# Patient Record
Sex: Male | Born: 1960 | Race: White | Hispanic: No | Marital: Single | Smoking: Never smoker
Health system: Southern US, Community
[De-identification: ages and names within clinical notes are randomized; demographics above are authoritative.]

## PROBLEM LIST (undated history)

## (undated) DIAGNOSIS — I872 Venous insufficiency (chronic) (peripheral): Secondary | ICD-10-CM

---

## 2016-02-09 ENCOUNTER — Emergency Department (HOSPITAL_COMMUNITY): Payer: Managed Care, Other (non HMO)

## 2016-02-09 ENCOUNTER — Emergency Department (HOSPITAL_COMMUNITY)
Admission: EM | Admit: 2016-02-09 | Discharge: 2016-02-10 | Disposition: A | Discharge status: Home / Self Care | Payer: Managed Care, Other (non HMO) | Attending: Emergency Medicine | Admitting: Emergency Medicine

## 2016-02-09 ENCOUNTER — Encounter (HOSPITAL_COMMUNITY): Payer: Self-pay

## 2016-02-09 DIAGNOSIS — R51 Headache: Secondary | ICD-10-CM | POA: Diagnosis present

## 2016-02-09 DIAGNOSIS — H81399 Other peripheral vertigo, unspecified ear: Secondary | ICD-10-CM | POA: Insufficient documentation

## 2016-02-09 DIAGNOSIS — R42 Dizziness and giddiness: Secondary | ICD-10-CM

## 2016-02-09 LAB — BASIC METABOLIC PANEL
Anion gap: 9 (ref 5–15)
BUN: 13 mg/dL (ref 6–20)
CALCIUM: 9.1 mg/dL (ref 8.9–10.3)
CO2: 30 mmol/L (ref 22–32)
CREATININE: 0.87 mg/dL (ref 0.61–1.24)
Chloride: 98 mmol/L — ABNORMAL LOW (ref 101–111)
GFR calc Af Amer: >60 mL/min (ref >60)
GLUCOSE: 120 mg/dL — AB (ref 65–99)
POTASSIUM: 3.8 mmol/L (ref 3.5–5.1)
Sodium: 137 mmol/L (ref 135–145)

## 2016-02-09 LAB — CBC
HEMATOCRIT: 43.1 % (ref 39.0–52.0)
Hemoglobin: 14.3 g/dL (ref 13.0–17.0)
MCH: 30.8 pg (ref 26.0–34.0)
MCHC: 33.2 g/dL (ref 30.0–36.0)
MCV: 92.9 fL (ref 78.0–100.0)
PLATELETS: 208 10*3/uL (ref 150–400)
RBC: 4.64 MIL/uL (ref 4.22–5.81)
RDW: 14.6 % (ref 11.5–15.5)
WBC: 7.7 10*3/uL (ref 4.0–10.5)

## 2016-02-09 LAB — CBG MONITORING, ED: Glucose-Capillary: 108 mg/dL — ABNORMAL HIGH (ref 65–99)

## 2016-02-09 MED ORDER — IOPAMIDOL (ISOVUE-370) INJECTION 76%
INTRAVENOUS | Status: AC
  Administered 2016-02-10: 100 mL
  Filled 2016-02-09: qty 100

## 2016-02-09 MED ORDER — LORAZEPAM 2 MG/ML IJ SOLN
1.0000 mg | Freq: Once | INTRAMUSCULAR | Status: AC
  Administered 2016-02-09: 1 mg via INTRAVENOUS
  Filled 2016-02-09: qty 1

## 2016-02-09 MED ORDER — SODIUM CHLORIDE 0.9 % IV BOLUS (SEPSIS)
500.0000 mL | Freq: Once | INTRAVENOUS | Status: AC
  Administered 2016-02-09: 500 mL via INTRAVENOUS

## 2016-02-09 NOTE — ED Triage Notes (Signed)
Pt comes via [facility], c/o HA, dizziness and nausea starting around 530, has had these symptoms for about one week, PTA received 4 mg of zofran, talked to PCP and was told he may have vertigo

## 2016-02-09 NOTE — ED Notes (Signed)
Patient transported to CT 

## 2016-02-09 NOTE — ED Provider Notes (Signed)
By signing my name below, I, Linus GalasMaharshi Patel, attest that this documentation has been prepared under the direction and in the presence of Glynn OctaveStephen Samrat Hayward, MD. Electronically Signed: Linus GalasMaharshi Patel, ED Scribe. 02/09/16. 11:06 PM.  HPI Comments: Antonio Salazar is a 55 y.o. male who presents to the Emergency Department complaining of intermittent dizziness described as a spinning sensation that has been ongoing for the past one week but worsened over the last few days. Pt states he was at work sitting when he suddenly became dizzy. At 5:30 PM he began having a HA localized to the posterior base of his head which suddenly worsened 1 hour hour later at 6:30 PM. Pt states his pain is worse when he stands up and is aggravated by light. It is alleviated with rest. He states he still is dizzy while in the ED. Pt denies any fevers, chills, CP, SOB, N/V/D or any other symptoms at this time. [place]. Pt denies hx of DM or HTN.   Pt recently saw his PCP who placed him on a new diuretic for the swelling in his legs.   Physical Exam RRR, CTAB, morbidly obese  CN 2-12 intact, no ataxia on finger to nose, no nystagmus, 5/5 strength throughout, no pronator drift, Romberg positve, gait not tested,   With intermittent dizziness that resolved last week but recurred again tonight around 5:30 with sudden onset headache at 6:30 PM. Worse with standing up and movement. Nonfocal neurological exam other than positive Romberg  CT negative obtained within 6 hours of sudden onset headache. Patient morbidly obese unlikely to be able to get MRI.  Treat for apparent peripheral vertigo.  I personally performed the services described in this documentation, which was scribed in my presence. The recorded information has been reviewed and is accurate.    Glynn OctaveStephen Sira Adsit, MD 02/10/16 [phone]

## 2016-02-09 NOTE — ED Provider Notes (Signed)
[facility]-EMERGENCY DEPT Provider Note   CSN: [id number] Arrival date & time: 02/09/16  2221     History   Chief Complaint Chief Complaint  Patient presents with  . Headache  . Dizziness    HPI   Blood pressure 133/79, pulse 87, temperature 98.2 F (36.8 C), temperature source Oral, resp. rate 17, height 6\' 1"  (1.854 m), weight (!) 213.2 kg, SpO2 94 %.  Antonio Salazar is a 55 y.o. male complaining of severe occipital headache which is atypical for him with associated nausea and sensation the room is spinning onset 8 days ago following a upper respiratory infection. No pain medication taken prior to arrival, he states that his symptoms significantly worsened today to the point where he is extremely nauseous, any head movement makes this worse than normal. He is able to ambulate but with difficulty. He denies change in his vision (note that he states that he is much more comfortable when he keeps his eyes close), dysarthria, numbness, weakness, ataxia. He did start an unknown diuretic 2 weeks ago by his primary care physician.  History reviewed. No pertinent past medical history.  There are no active problems to display for this patient.   History reviewed. No pertinent surgical history.     Home Medications    Prior to Admission medications   Medication Sig Start Date End Date Taking? Authorizing Provider  hydrochlorothiazide (HYDRODIURIL) 25 MG tablet Take 25 mg by mouth daily. 01/22/16  Yes Historical Provider, MD  meclizine (ANTIVERT) 25 MG tablet Take 1 tablet (25 mg total) by mouth 3 (three) times daily as needed for dizziness. 02/10/16   Joni Reining Keaundre Thelin, PA-C    Family History No family history on file.  Social History Social History  Substance Use Topics  . Smoking status: Never Smoker  . Smokeless tobacco: Never Used  . Alcohol use No     Allergies   Patient has no known allergies.   Review of Systems Review of Systems  10 systems reviewed and found to  be negative, except as noted in the HPI.   Physical Exam Updated Vital Signs BP 103/85   Pulse 66   Temp 98.2 F (36.8 C)   Resp 13   Ht 6\' 1"  (1.854 m)   Wt (!) 202 kg   SpO2 98%   BMI 58.75 kg/m   Physical Exam  Constitutional: He is oriented to person, place, and time. He appears well-developed and well-nourished. No distress.  Morbidly obese  HENT:  Head: Normocephalic and atraumatic.  Mouth/Throat: Oropharynx is clear and moist.  Eyes: Conjunctivae and EOM are normal. Pupils are equal, round, and reactive to light.  Neck: Normal range of motion.  Cardiovascular: Normal rate, regular rhythm and intact distal pulses.   Pulmonary/Chest: Effort normal and breath sounds normal.  Abdominal: Soft. There is no tenderness.  Musculoskeletal: Normal range of motion.  Neurological: He is alert and oriented to person, place, and time.  II-Visual fields grossly intact. III/IV/VI-Extraocular movements intact.  Pupils reactive bilaterally. V/VII-Smile symmetric, equal eyebrow raise,  facial sensation intact VIII- Hearing grossly intact IX/X-Normal gag XI-bilateral shoulder shrug XII-midline tongue extension Motor: 5/5 bilaterally with normal tone and bulk Cerebellar: Normal finger-to-nose  and normal heel-to-shin test.    Patient is not ambulated  Dix-Hallpike with no nystagmus bilaterally   Skin: He is not diaphoretic.  Psychiatric: He has a normal mood and affect.  Nursing note and vitals reviewed.    ED Treatments / Results  Labs (all labs ordered are  listed, but only abnormal results are displayed) Labs Reviewed  BASIC METABOLIC PANEL - Abnormal; Notable for the following:       Result Value   Chloride 98 (*)    Glucose, Bld 120 (*)    All other components within normal limits  URINALYSIS, ROUTINE W REFLEX MICROSCOPIC (NOT AT [facility]) - Abnormal; Notable for the following:    Specific Gravity, Urine 1.035 (*)    All other components within normal limits  CBG  MONITORING, ED - Abnormal; Notable for the following:    Glucose-Capillary 108 (*)    All other components within normal limits  CBC    EKG  EKG Interpretation None       Radiology Ct Angio Head W Or Wo Contrast  Result Date: 02/10/2016 CLINICAL DATA:  55 y/o  M; headache and dizziness. EXAM: CT OF THE HEAD WITHOUT CONTRAST CT ANGIOGRAPHY HEAD AND NECK TECHNIQUE: Multidetector CT imaging of the head and neck was performed using the standard protocol during bolus administration of intravenous contrast. Multiplanar CT image reconstructions and MIPs were obtained to evaluate the vascular anatomy. Carotid stenosis measurements (when applicable) are obtained utilizing NASCET criteria, using the distal internal carotid diameter as the denominator. CONTRAST:  100 cc Isovue 370 COMPARISON:  None. FINDINGS: CT HEAD FINDINGS Brain: No evidence of acute infarction, hemorrhage, hydrocephalus, extra-axial collection or mass lesion/mass effect. Vascular: See below. Skull: No fracture or destructive lesion. Mastoids and middle ears are clear. Sinuses: Right maxillary sinus mucous retention cyst. Orbits: No acute finding. Review of the MIP images confirms the above findings CTA NECK FINDINGS Aortic arch: Standard branching. Imaged portion shows no evidence of aneurysm or dissection. No significant stenosis of the major arch vessel origins. 3.6 cm arch (series 22, image 337). Right carotid system: No evidence of dissection, stenosis (50% or greater) or occlusion. Left carotid system: No evidence of dissection, stenosis (50% or greater) or occlusion. Mild calcific atherosclerosis of the bifurcation without significant stenosis. Vertebral arteries: Left dominant, the right likely terminates in PICA. No evidence of dissection, stenosis (50% or greater) or occlusion. Skeleton: No acute osseous abnormality. Mild cervical spondylosis without high-grade canal stenosis or foraminal narrowing. Other neck: No lymphadenopathy,  mass, or inflammatory process is identified. Patent aerodigestive tract. Upper chest: No acute process. Review of the MIP images confirms the above findings CTA HEAD FINDINGS Anterior circulation: No significant stenosis, proximal occlusion, aneurysm, or vascular malformation. Posterior circulation: No significant stenosis, proximal occlusion, aneurysm, or vascular malformation. Venous sinuses: As permitted by contrast timing, patent. Anatomic variants: Left fetal PCA. Diminutive A-comm. No right P-comm identified, likely hypoplastic or absent. Small caliber right vertebral artery largely terminates in diminutive PICA. Delayed phase: No abnormal intracranial enhancement. Review of the MIP images confirms the above findings IMPRESSION: 1. No evidence of dissection, hemodynamically significant stenosis, or occlusion of the carotid and vertebral arteries of the neck. 2. No significant stenosis, proximal occlusion, aneurysm, or vascular malformation of circle of Willis. 3. No acute intracranial abnormality is identified. No abnormal intracranial enhancement. 4. Mild aortic atherosclerosis. 5. Mild calcification of left carotid bifurcation without significant stenosis. 6. 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 Recommend annual imaging followup by CTA or MRA. This recommendation follows 2010 ACCF/AHA/AATS/ACR/ASA/SCA/SCAI/SIR/STS/SVM Guidelines for the Diagnosis and Management of Patients with Thoracic Aortic Disease. Circulation.2010; 121: Z610-R604 Electronically Signed   By: Mitzi Hansen M.D.   On: 02/10/2016 00:50   Ct Head Wo Contrast  Result Date: 02/10/2016 CLINICAL DATA:  55 y/o  M; headache and dizziness.  EXAM: CT OF THE HEAD WITHOUT CONTRAST CT ANGIOGRAPHY HEAD AND NECK TECHNIQUE: Multidetector CT imaging of the head and neck was performed using the standard protocol during bolus administration of intravenous contrast. Multiplanar CT image reconstructions and MIPs were obtained to evaluate the vascular anatomy.  Carotid stenosis measurements (when applicable) are obtained utilizing NASCET criteria, using the distal internal carotid diameter as the denominator. CONTRAST:  100 cc Isovue 370 COMPARISON:  None. FINDINGS: CT HEAD FINDINGS Brain: No evidence of acute infarction, hemorrhage, hydrocephalus, extra-axial collection or mass lesion/mass effect. Vascular: See below. Skull: No fracture or destructive lesion. Mastoids and middle ears are clear. Sinuses: Right maxillary sinus mucous retention cyst. Orbits: No acute finding. Review of the MIP images confirms the above findings CTA NECK FINDINGS Aortic arch: Standard branching. Imaged portion shows no evidence of aneurysm or dissection. No significant stenosis of the major arch vessel origins. 3.6 cm arch (series 22, image 337). Right carotid system: No evidence of dissection, stenosis (50% or greater) or occlusion. Left carotid system: No evidence of dissection, stenosis (50% or greater) or occlusion. Mild calcific atherosclerosis of the bifurcation without significant stenosis. Vertebral arteries: Left dominant, the right likely terminates in PICA. No evidence of dissection, stenosis (50% or greater) or occlusion. Skeleton: No acute osseous abnormality. Mild cervical spondylosis without high-grade canal stenosis or foraminal narrowing. Other neck: No lymphadenopathy, mass, or inflammatory process is identified. Patent aerodigestive tract. Upper chest: No acute process. Review of the MIP images confirms the above findings CTA HEAD FINDINGS Anterior circulation: No significant stenosis, proximal occlusion, aneurysm, or vascular malformation. Posterior circulation: No significant stenosis, proximal occlusion, aneurysm, or vascular malformation. Venous sinuses: As permitted by contrast timing, patent. Anatomic variants: Left fetal PCA. Diminutive A-comm. No right P-comm identified, likely hypoplastic or absent. Small caliber right vertebral artery largely terminates in  diminutive PICA. Delayed phase: No abnormal intracranial enhancement. Review of the MIP images confirms the above findings IMPRESSION: 1. No evidence of dissection, hemodynamically significant stenosis, or occlusion of the carotid and vertebral arteries of the neck. 2. No significant stenosis, proximal occlusion, aneurysm, or vascular malformation of circle of Willis. 3. No acute intracranial abnormality is identified. No abnormal intracranial enhancement. 4. Mild aortic atherosclerosis. 5. Mild calcification of left carotid bifurcation without significant stenosis. 6. 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 Recommend annual imaging followup by CTA or MRA. This recommendation follows 2010 ACCF/AHA/AATS/ACR/ASA/SCA/SCAI/SIR/STS/SVM Guidelines for the Diagnosis and Management of Patients with Thoracic Aortic Disease. Circulation.2010; 121: Z610-R604: [id number] Electronically Signed   By: Mitzi HansenLance  Furusawa-Stratton M.D.   On: 02/10/2016 00:50   Ct Angio Neck W And/or Wo Contrast  Result Date: 02/10/2016 CLINICAL DATA:  55 y/o  M; headache and dizziness. EXAM: CT OF THE HEAD WITHOUT CONTRAST CT ANGIOGRAPHY HEAD AND NECK TECHNIQUE: Multidetector CT imaging of the head and neck was performed using the standard protocol during bolus administration of intravenous contrast. Multiplanar CT image reconstructions and MIPs were obtained to evaluate the vascular anatomy. Carotid stenosis measurements (when applicable) are obtained utilizing NASCET criteria, using the distal internal carotid diameter as the denominator. CONTRAST:  100 cc Isovue 370 COMPARISON:  None. FINDINGS: CT HEAD FINDINGS Brain: No evidence of acute infarction, hemorrhage, hydrocephalus, extra-axial collection or mass lesion/mass effect. Vascular: See below. Skull: No fracture or destructive lesion. Mastoids and middle ears are clear. Sinuses: Right maxillary sinus mucous retention cyst. Orbits: No acute finding. Review of the MIP images confirms the above findings CTA NECK FINDINGS  Aortic arch: Standard branching. Imaged portion shows no evidence  of aneurysm or dissection. No significant stenosis of the major arch vessel origins. 3.6 cm arch (series 22, image 337). Right carotid system: No evidence of dissection, stenosis (50% or greater) or occlusion. Left carotid system: No evidence of dissection, stenosis (50% or greater) or occlusion. Mild calcific atherosclerosis of the bifurcation without significant stenosis. Vertebral arteries: Left dominant, the right likely terminates in PICA. No evidence of dissection, stenosis (50% or greater) or occlusion. Skeleton: No acute osseous abnormality. Mild cervical spondylosis without high-grade canal stenosis or foraminal narrowing. Other neck: No lymphadenopathy, mass, or inflammatory process is identified. Patent aerodigestive tract. Upper chest: No acute process. Review of the MIP images confirms the above findings CTA HEAD FINDINGS Anterior circulation: No significant stenosis, proximal occlusion, aneurysm, or vascular malformation. Posterior circulation: No significant stenosis, proximal occlusion, aneurysm, or vascular malformation. Venous sinuses: As permitted by contrast timing, patent. Anatomic variants: Left fetal PCA. Diminutive A-comm. No right P-comm identified, likely hypoplastic or absent. Small caliber right vertebral artery largely terminates in diminutive PICA. Delayed phase: No abnormal intracranial enhancement. Review of the MIP images confirms the above findings IMPRESSION: 1. No evidence of dissection, hemodynamically significant stenosis, or occlusion of the carotid and vertebral arteries of the neck. 2. No significant stenosis, proximal occlusion, aneurysm, or vascular malformation of circle of Willis. 3. No acute intracranial abnormality is identified. No abnormal intracranial enhancement. 4. Mild aortic atherosclerosis. 5. Mild calcification of left carotid bifurcation without significant stenosis. 6. 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  Recommend annual imaging followup by CTA or MRA. This recommendation follows 2010 ACCF/AHA/AATS/ACR/ASA/SCA/SCAI/SIR/STS/SVM Guidelines for the Diagnosis and Management of Patients with Thoracic Aortic Disease. Circulation.2010; 121: Z610-R604 Electronically Signed   By: Mitzi Hansen M.D.   On: 02/10/2016 00:50   Mr Brain Wo Contrast  Result Date: 02/10/2016 CLINICAL DATA:  55 y/o  M; ataxia. EXAM: MRI HEAD WITHOUT CONTRAST TECHNIQUE: Multiplanar, multiecho pulse sequences of the brain and surrounding structures were obtained without intravenous contrast. COMPARISON:  CTA head and neck and CT head dated 02/09/2016. FINDINGS: Brain: No diffusion signal abnormality. Motion degraded T2 FLAIR and SWI sequences. No large gross hemorrhage identified. No focal mass effect, extra-axial collection, or effacement of basilar cisterns. Vascular: Normal flow voids. Skull and upper cervical spine: Normal marrow signal. Sinuses/Orbits: Right maxillary sinus mucous retention cyst. No abnormal signal of mastoid air cells. Orbits are unremarkable. Other: None. IMPRESSION: No acute intracranial abnormality identified. Right maxillary sinus mucous retention cyst. Otherwise unremarkable MRI of the brain. Motion degradation of several sequences. Electronically Signed   By: Mitzi Hansen M.D.   On: 02/10/2016 04:42    Procedures Procedures (including critical care time)  Medications Ordered in ED Medications  sodium chloride 0.9 % bolus 500 mL (0 mLs Intravenous Stopped 02/10/16 0549)  LORazepam (ATIVAN) injection 1 mg (1 mg Intravenous Given 02/09/16 2315)  iopamidol (ISOVUE-370) 76 % injection (100 mLs  Contrast Given 02/10/16 0002)  meclizine (ANTIVERT) tablet 25 mg (25 mg Oral Given 02/10/16 0211)     Initial Impression / Assessment and Plan / ED Course  I have reviewed the triage vital signs and the nursing notes.  Pertinent labs & imaging results that were available during my care of the  patient were reviewed by me and considered in my medical decision making (see chart for details).  Clinical Course    Vitals:   02/10/16 0209 02/10/16 0230 02/10/16 0300 02/10/16 0532  BP:  129/81 136/82 103/85  Pulse:  81 66   Resp:  14  13  Temp: 98.2 F (36.8 C)     TempSrc:      SpO2:  96% 98%   Weight:      Height:        Medications  sodium chloride 0.9 % bolus 500 mL (0 mLs Intravenous Stopped 02/10/16 0549)  LORazepam (ATIVAN) injection 1 mg (1 mg Intravenous Given 02/09/16 2315)  iopamidol (ISOVUE-370) 76 % injection (100 mLs  Contrast Given 02/10/16 0002)  meclizine (ANTIVERT) tablet 25 mg (25 mg Oral Given 02/10/16 0211)    Antonio Salazar is 55 y.o. male presenting with Severe vertigo, he is also reporting a severe occipital headache which is atypical for him. Neurologic exam is grossly nonfocal however Dix-Hallpike is negative, patient is not ambulated. Given the severity of his headache would consider a central cause however, with the acute onset think is likely peripheral. CT angiogram negative, MRI negative. Trial of ambulation successful. Patient given ENT follow-up.  Upon discharge patient states that he does not feel comfortable going home because he is afraid he might fall. Discussed with Dr. Toniann FailKakrakandy, who states the patient can be evaluated by PT in the a.m. and if they feel he is appropriate for admission.   Case signed out to PA Hedges at shift change.   Final Clinical Impressions(s) / ED Diagnoses   Final diagnoses:  Peripheral vertigo, unspecified laterality    New Prescriptions New Prescriptions   MECLIZINE (ANTIVERT) 25 MG TABLET    Take 1 tablet (25 mg total) by mouth 3 (three) times daily as needed for dizziness.     Wynetta Emeryicole Wing Gfeller, PA-C 02/10/16 0457    Wynetta EmeryNicole Raegan Winders, PA-C 02/10/16 [id number]    Glynn OctaveStephen Rancour, MD 02/10/16 [phone]

## 2016-02-10 ENCOUNTER — Emergency Department (HOSPITAL_COMMUNITY): Payer: Managed Care, Other (non HMO)

## 2016-02-10 LAB — URINALYSIS, ROUTINE W REFLEX MICROSCOPIC
BILIRUBIN URINE: NEGATIVE
GLUCOSE, UA: NEGATIVE mg/dL
Hgb urine dipstick: NEGATIVE
KETONES UR: NEGATIVE mg/dL
Leukocytes, UA: NEGATIVE
NITRITE: NEGATIVE
PH: 6 (ref 5.0–8.0)
Protein, ur: NEGATIVE mg/dL
Specific Gravity, Urine: 1.035 — ABNORMAL HIGH (ref 1.005–1.030)

## 2016-02-10 MED ORDER — MECLIZINE HCL 25 MG PO TABS: 25.0000 mg | ORAL_TABLET | Freq: Three times a day (TID) | ORAL | 0 refills | Status: AC | PRN

## 2016-02-10 MED ORDER — MECLIZINE HCL 25 MG PO TABS
25.0000 mg | ORAL_TABLET | Freq: Once | ORAL | Status: AC
  Administered 2016-02-10: 25 mg via ORAL
  Filled 2016-02-10: qty 1

## 2016-02-10 NOTE — ED Notes (Signed)
Pt ambulated around room with steady gait, pt still complains of dizziness

## 2016-02-10 NOTE — ED Notes (Signed)
Spoke with case management about pt referral to outpt PT follow-up. Case management able to put referral in.

## 2016-02-10 NOTE — ED Notes (Signed)
Spoke with Pt and they recommend outpt PT follow-up. Eyvonne MechanicJeffrey Hedges, PA notified.

## 2016-02-10 NOTE — ED Notes (Signed)
Patient transported back from CT 

## 2016-02-10 NOTE — ED Notes (Signed)
PT contacted to ensure pt is on list for them to see.

## 2016-02-10 NOTE — Discharge Instructions (Signed)
Please follow with your primary care doctor in the next 2 days for a check-up. They must obtain records for further management.  ° °Do not hesitate to return to the Emergency Department for any new, worsening or concerning symptoms.  ° °

## 2016-02-10 NOTE — ED Notes (Signed)
Physical Therapists are here to see patient

## 2016-02-10 NOTE — ED Notes (Signed)
Patient had already d/c'd himself from the monitor, continuous pulse oximetry and blood pressure cuff; I d/c'd patient from IV, patient is getting dressed to be discharged home

## 2016-02-10 NOTE — Discharge Planning (Signed)
[facility] and vestibular [facility]hab.  Referral placed and Outpatient Rehab will contact pt with appointment date and time.

## 2016-02-10 NOTE — Progress Notes (Signed)
Physical Therapy Evaluation Patient Details Name: Antonio Salazar MRN: [id number] DOB: 22-Apr-1960 Today's Date: 02/10/2016   History of Present Illness  Antonio Mangorthur Muzyka is a 55 y.o. male who presents to the Emergency Department complaining of intermittent dizziness described as a spinning sensation that has been ongoing for the past one week but worsened over the last few days. Pt states he was at work sitting when he suddenly became dizzy. At 5:30 PM he began having a HA localized to the posterior base of his head which suddenly worsened 1 hour hour later at 6:30 PM. Pt states his pain is worse when he stands up and is aggravated by light. It is alleviated with rest.  CT and MRI negative for stroke and hemorrhage  Clinical Impression  Vestibular assessment completed however unable to illicit positional vertigo, suspect due to meclizine given at 215am. Pt functioning at mod I but suspect based on report of symptoms prior to coming to ED pt with peripheral vestibular dysfunction. Recommending outpt for vestibular assessment. Advised pt to not take meclizine 24 hours prior to PT appt if tolerable for accurate assessment. Acute PT to con't to follow.    Follow Up Recommendations Outpatient PT;Supervision - Intermittent (for vestibular treatement)    Equipment Recommendations  None recommended by PT    Recommendations for Other Services       Precautions / Restrictions Precautions Precautions: Fall Precaution Comments: dizziness with mobility Restrictions Weight Bearing Restrictions: No      Mobility  Bed Mobility Overal bed mobility: Modified Independent             General bed mobility comments: pt reports mild lightheadedness with transistion from supine to sit but went away s/p 15 seconds  Transfers Overall transfer level: Modified independent Equipment used: None             General transfer comment: no signs of instability or  dizziness  Ambulation/Gait Ambulation/Gait assistance: Modified independent (Device/Increase time) Ambulation Distance (Feet): 200 Feet Assistive device: None Gait Pattern/deviations: WFL(Within Functional Limits) Gait velocity: wfl Gait velocity interpretation: at or above normal speed for age/gender General Gait Details: pt dizziness or lightheadedness, pt able to turn head left and right, up and down without report of dizziness  Stairs            Wheelchair Mobility    Modified Rankin (Stroke Patients Only)       Balance Overall balance assessment: No apparent balance deficits (not formally assessed)                                           Pertinent Vitals/Pain Pain Assessment: 0-10 Pain Score: 5  Pain Location: base of occiput Pain Descriptors / Indicators: Headache Pain Intervention(s): Monitored during session    Home Living Family/patient expects to be discharged to:: Private residence Living Arrangements: Alone Available Help at Discharge: Family;Available 24 hours/day Type of Home: House Home Access: Stairs to enter Entrance Stairs-Rails: Right Entrance Stairs-Number of Steps: flight (x2) Home Layout: One level        Prior Function Level of Independence: Independent         Comments: works as a Administrator, artssupervision at [employer]     Hand Dominance   Dominant Hand: Right    Extremity/Trunk Assessment   Upper Extremity Assessment: Overall WFL for tasks assessed           Lower Extremity  Assessment: Overall WFL for tasks assessed      Cervical / Trunk Assessment: Normal  Communication   Communication: No difficulties  Cognition Arousal/Alertness: Awake/alert Behavior During Therapy: WFL for tasks assessed/performed Overall Cognitive Status: Within Functional Limits for tasks assessed                      General Comments General comments (skin integrity, edema, etc.): Vestibular Assessment: pt received meclizine  at 215am. no nystagmus or report of dizziness with dix halpike to L or R or during horizontal roll test ruling out posterior/horizontal canal BPPV. Pt with difficulty focusing on PTs nose during head thrust test. unable to to illicit dizziness with positioning. could be because of meclinize    Exercises     Assessment/Plan    PT Assessment Patient needs continued PT services  PT Problem List Other (comment) (vestibular dysfunction)          PT Treatment Interventions Other (comment) (vestibular treatment)    PT Goals (Current goals can be found in the Care Plan section)  Acute Rehab PT Goals Patient Stated Goal: home asap PT Goal Formulation: With patient Time For Goal Achievement: 02/17/16 Potential to Achieve Goals: Good Additional Goals Additional Goal #1: Pt to be indep with gaze stabilization compensatory technique 100% of time    Frequency Min 3X/week (for vestibular)   Barriers to discharge        Co-evaluation               End of Session   Activity Tolerance: Patient tolerated treatment well Patient left: in bed;with call bell/phone within reach Nurse Communication: Mobility status    Functional Assessment Tool Used: clinical judgement Functional Limitation: Mobility: Walking and moving around Mobility: Walking and Moving Around Current Status (A5409(G8978): At least 20 percent but less than 40 percent impaired, limited or restricted Mobility: Walking and Moving Around Goal Status [phone](G8979): At least 1 percent but less than 20 percent impaired, limited or restricted    Time: 1002-1036 PT Time Calculation (min) (ACUTE ONLY): 34 min   Charges:   PT Evaluation $PT Eval Moderate Complexity: 1 Procedure PT Treatments $Canalith Rep Proc: 8-22 mins   PT G Codes:   PT G-Codes **NOT FOR INPATIENT CLASS** Functional Assessment Tool Used: clinical judgement Functional Limitation: Mobility: Walking and moving around Mobility: Walking and Moving Around Current  Status (Y7829(G8978): At least 20 percent but less than 40 percent impaired, limited or restricted Mobility: Walking and Moving Around Goal Status [phone](G8979): At least 1 percent but less than 20 percent impaired, limited or restricted    Antonio Salazar 02/10/2016, 11:22 AM   Antonio Salazar, PT, DPT Pager #: [phone] Office #: [phone]

## 2016-02-26 ENCOUNTER — Ambulatory Visit: Payer: Managed Care, Other (non HMO) | Attending: Physician Assistant

## 2016-03-01 ENCOUNTER — Ambulatory Visit: Payer: Managed Care, Other (non HMO)

## 2016-03-09 ENCOUNTER — Ambulatory Visit: Payer: Managed Care, Other (non HMO) | Admitting: *Deleted

## 2016-03-25 ENCOUNTER — Ambulatory Visit: Payer: Commercial Managed Care - PPO | Attending: Physician Assistant

## 2016-03-25 DIAGNOSIS — R42 Dizziness and giddiness: Secondary | ICD-10-CM | POA: Insufficient documentation

## 2016-03-25 DIAGNOSIS — R2689 Other abnormalities of gait and mobility: Secondary | ICD-10-CM | POA: Diagnosis present

## 2016-03-25 NOTE — Therapy (Signed)
[facility] [address] [place], [place], [address] Phone: [phone]   Fax:  [phone]  Physical Therapy Evaluation  Patient Details  Name: Antonio Salazar MRN: [id number] Date of Birth: [birth year] Referring Provider: Dr. Jearld FentonByers, Dr. Glynn OctaveStephen Rancour  Encounter Date: 03/25/2016      PT End of Session - 03/25/16 1701    Visit Number 1   Number of Visits 17   Date for PT Re-Evaluation 05/24/16   Authorization Type [employer]-60 visit limit   PT Start Time 1405   PT Stop Time 1451   PT Time Calculation (min) 46 min   Activity Tolerance Other (comment)  limited by dizziness   Behavior During Therapy [facility] for tasks assessed/performed      History reviewed. No pertinent past medical history.  History reviewed. No pertinent surgical history.  There were no vitals filed for this visit.       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 Recent episode of vertigo began on 02/10/16 (works night shift, 4:30pm-3:00pm) while at work, sudden onset. Pt reports he felt dizzy and believes he passed out for approx. 20 minutes. Pt reports tinnitus and pressure in the occiput area occurs with dizziness. Pt reports he also experienced nausea with initial dizziness and went to the ED. The imaging was negative in ED, and he was told he has vertigo. 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 Jearld FentonByers (per pt report). Pt reports a high pitched "hissing" sound. Pt did not wish to complete positional testing today.    Pertinent History No significant PMH    Patient Stated Goals "I want to be able to function again".    Currently in Pain? Yes   Pain Score 5    Pain Location Head  Headache   Pain Orientation  Posterior   Pain Descriptors / Indicators Headache   Pain Type Chronic pain   Pain Onset More than a month ago   Pain Frequency Constant   Aggravating Factors  flashing lights   Pain Relieving Factors rest            [facility] - 03/25/16 1432      Assessment   Medical Diagnosis Peripheral vertigo   Referring Provider Dr. Jearld FentonByers, Dr. Jeannett SeniorStephen Rancour   Onset Date/Surgical Date 02/09/17   Prior Therapy in acute care on 02/10/16     Precautions   Precautions Fall     Restrictions   Weight Bearing Restrictions No     Balance Screen   Has the patient fallen in the past 6 months Yes   How many times? 2   Has the patient had a decrease in activity level because of a fear of falling?  Yes   Is the patient reluctant to leave their home because of a fear of falling?  Yes     Home Environment   Living Environment Private residence   Living Arrangements Alone   Available Help at Discharge Family   Type of Home House   Home Access Stairs to enter  pt enters through basement   Entrance Stairs-Number of Steps 12   Entrance Stairs-Rails Can reach both   Home Layout Two level   Home Equipment None     Prior Function  Level of Independence Independent   Vocation Full time employment   Vocation Requirements [employer] job     Cognition   Overall Cognitive Status Within Functional Limits for tasks assessed     Observation/Other Assessments   Focus on Therapeutic Outcomes (FOTO)  DHI: 90%-indicates pt perceives dizziness has severe impact functional activities.      Ambulation/Gait   Ambulation/Gait Yes   Ambulation/Gait Assistance 5: Supervision   Ambulation/Gait Assistance Details Pt noted to amb. in guarded manner, especially during turns.    Ambulation Distance (Feet) 100 Feet   Assistive device None   Gait Pattern Step-through pattern;Decreased arm swing - right;Decreased arm swing - left;Decreased stride length;Wide base of support   Ambulation Surface  Level;Indoor   Gait velocity 2.88ft/sec.            Vestibular Assessment - 03/25/16 1443      Vestibular Assessment   General Observation Pt reported some improvement while taking prednisone taper, which he completed approx. 2 weeks ago. Dizziness: 8/10 at worst; 2-3/10 at best     Symptom Behavior   Type of Dizziness Spinning   Frequency of Dizziness Daily   Duration of Dizziness Constant   Aggravating Factors Turning head quickly   Relieving Factors Rest     Occulomotor Exam   Occulomotor Alignment Normal   Spontaneous Absent   Gaze-induced Absent   Smooth Pursuits Intact   Saccades Intact   Comment Pt reported dizziness during down gaze smooth pursuits.     Vestibulo-Occular Reflex   VOR 1 Head Only (x 1 viewing) Pt had difficulty keeping eyes open and reported 7-8/10 dizziness. Pt required seated rest break to allow dizziness to subside, <1 minute.                       PT Education - 03/25/16 1700    Education provided Yes   Education Details PT discussed outcome measures and POC (frequency/duration) and that next session we would perform positional testing to r/o BPPV. Pt agreeable to positional testing next session.    Person(s) Educated Patient   Methods Explanation   Comprehension Verbalized understanding          PT Short Term Goals - 03/25/16 1710      PT SHORT TERM GOAL #1   Title Pt will be IND in HEP to improve dizziness and balance. TARGET DATE FOR ALL STGS: 04/22/16   Status New     PT SHORT TERM GOAL #2   Title Pt will verbalize dizziness is 5/10 at worst during head turns, in order to improve safety during functional mobility.    Status New     PT SHORT TERM GOAL #3   Title Perform FGA as indicated and write goal.    Status New     PT SHORT TERM GOAL #4   Title Perform positional testing and write goals as indicated.   Status New           PT Long Term Goals - 03/25/16 1711      PT LONG TERM GOAL #1   Title Pt will  improve gait speed to >/=2.37ft/sec to safely amb. in the community. TARGET DATE: 05/20/16   Status New     PT LONG TERM GOAL #2   Title Pt will amb. 500', IND, over even/uneven terrain, with </=1/10 dizziness while performing head turns in order to improve functional mobility.    Status New     PT LONG TERM  GOAL #3   Title Pt will will improve DHI score to >/=72% to improve quality of life.    Status New               Plan - 03/25/16 1702    Clinical Impression Statement Pt is a pleasant 55y/o male presenting to OPPT neuro for vertigo. Pt's PMH is not significant but he does have family hx of vertigo. The following impairments were noted during pt's exam: dizziness, gait deviations, and impaired balance. Pt's sx's provoked during VOR indicating decr. vestibular input. Positional testing not performed today 2/2 time constraints and pt request to not perform testing today, therefore, PT will perform next session as tolerated by pt. PT reviewed Dr. Jearld Fenton notes, and saw that he diagnosed pt with sensorineural hearing loss and potential acute labrynthitis. Pt's gait speed indicates pt is just above the cut-off for falls risk, however, pt has exeperienced 2 falls in the last 6 months. Pt's gait speed indicates pt is not able to safely amb. in the community. Pt's DHI score indicates pt feels dizziness has severe impact on functional abilities.    Rehab Potential Good   Clinical Impairments Affecting Rehab Potential obesity might impact pt's ability to obtain testing/treatment positions   PT Frequency 2x / week   PT Duration 8 weeks   PT Treatment/Interventions ADLs/Self Care Home Management;Biofeedback;Canalith Repostioning;Neuromuscular re-education;Balance training;Therapeutic exercise;Therapeutic activities;Functional mobility training;Stair training;Gait training;DME Instruction;Patient/family education;Vestibular;Manual techniques   PT Next Visit Plan Positional testing and treat for BPPV as  indicated, initiate gaze stab. HEP as tolerated.    Consulted and Agree with Plan of Care Patient      Patient will benefit from skilled therapeutic intervention in order to improve the following deficits and impairments:  Abnormal gait, Decreased endurance, Obesity, Pain, Impaired flexibility, Dizziness, Decreased balance, Decreased mobility, Decreased knowledge of use of DME (PT will not directly address pain but will continue to monitor closely.)  Visit Diagnosis: Dizziness and giddiness - Plan: PT plan of care cert/re-cert  Other abnormalities of gait and mobility - Plan: PT plan of care cert/re-cert     Problem List There are no active problems to display for this patient.   Antonio Salazar L 03/25/2016, 5:16 PM  [facility] [address] [place], [place], [address] Phone: [phone]   Fax:  [phone]  Name: Antonio Salazar MRN: [id number] Date of Birth: 1960-08-10  Zerita Boers, PT,DPT 03/25/16 5:16 PM Phone: [phone] Fax: [phone]

## 2016-03-29 ENCOUNTER — Ambulatory Visit: Payer: Commercial Managed Care - PPO

## 2016-03-29 DIAGNOSIS — R42 Dizziness and giddiness: Secondary | ICD-10-CM | POA: Diagnosis not present

## 2016-03-29 NOTE — Therapy (Signed)
[facility] [address] [place], [place], [address] Phone: [phone]   Fax:  [phone]  Physical Therapy Treatment  Patient Details  Name: Antonio Salazar MRN: [id number] Date of Birth: 02/02/1961 Referring Provider: Dr. Jearld Fenton, Dr. Glynn Octave  Encounter Date: 03/29/2016      PT End of Session - 03/29/16 1001    Visit Number 2   Number of Visits 17   Date for PT Re-Evaluation 05/24/16   Authorization Type [employer]-60 visit limit   PT Start Time 0925   PT Stop Time 0957   PT Time Calculation (min) 32 min   Activity Tolerance Other (comment)  limited by nausea and dizziness   Behavior During Therapy [facility] for tasks assessed/performed      History reviewed. No pertinent past medical history.  History reviewed. No pertinent surgical history.  There were no vitals filed for this visit.      Subjective Assessment - 03/29/16 0929    Subjective Pt reported he has not taken Meclizine since Sunday morning and reports it does help reduce dizziness. Pt reported mornings are worse for dizziness. Current dizziness: 7/10.    Pertinent History No significant PMH    Patient Stated Goals "I want to be able to function again".    Currently in Pain? Yes   Pain Location Head   Pain Orientation Posterior   Pain Descriptors / Indicators Headache   Pain Type Chronic pain   Pain Onset More than a month ago   Pain Frequency Constant   Aggravating Factors  flashing lights, TV   Pain Relieving Factors rest                Vestibular Assessment - 03/29/16 0932      Positional Testing   Dix-Hallpike Dix-Hallpike Right;Dix-Hallpike Left   Horizontal Canal Testing Horizontal Canal Right;Horizontal Canal Left     Dix-Hallpike Right   Dix-Hallpike Right Duration Pt reported 8/10. Pt reported dizziness feels like "someone is going to flip me but not spinning".   Dix-Hallpike Right Symptoms No nystagmus     Dix-Hallpike  Left   Dix-Hallpike Left Duration Pt reported 8/10 dizziness.    Dix-Hallpike Left Symptoms No nystagmus     Horizontal Canal Right   Horizontal Canal Right Duration 8/10 dizziness   Horizontal Canal Right Symptoms Normal     Horizontal Canal Left   Horizontal Canal Left Duration 8/10 dizziness   Horizontal Canal Left Symptoms Normal     Positional Sensitivities   Supine to Sitting Severe dizziness   Positional Sensitivities Comments Pt had to sit for 20 minutes after positional testing in order to allow dizziness to subside. Pt reports nausea during positional testing and supine to sit.                          PT Education - 03/29/16 1000    Education provided Yes   Education Details PT discussed pt following up with MD to medically manage dizziness, as pt is unable to participate in vestibular rehab at this time due to severity of symptoms. He was unable to tolerate any exercises at this time. PT will place pt on hold until his symptoms are less severe and send note to MD.    Person(s) Educated Patient   Methods Explanation   Comprehension Verbalized understanding          PT Short Term Goals - 03/25/16 1710      PT  SHORT TERM GOAL #1   Title Pt will be IND in HEP to improve dizziness and balance. TARGET DATE FOR ALL STGS: 04/22/16   Status New     PT SHORT TERM GOAL #2   Title Pt will verbalize dizziness is 5/10 at worst during head turns, in order to improve safety during functional mobility.    Status New     PT SHORT TERM GOAL #3   Title Perform FGA as indicated and write goal.    Status New     PT SHORT TERM GOAL #4   Title Perform positional testing and write goals as indicated.   Status New           PT Long Term Goals - 03/25/16 1711      PT LONG TERM GOAL #1   Title Pt will improve gait speed to >/=2.6862ft/sec to safely amb. in the community. TARGET DATE: 05/20/16   Status New     PT LONG TERM GOAL #2   Title Pt will amb. 500', IND,  over even/uneven terrain, with </=1/10 dizziness while performing head turns in order to improve functional mobility.    Status New     PT LONG TERM GOAL #3   Title Pt will will improve DHI score to >/=72% to improve quality of life.    Status New               Plan - 03/29/16 1002    Clinical Impression Statement Positional testing negative for nystagmus. Pt did experience dizziness in each testing position. Pt required a 20 minute seated rest break after testing 2/2 8/10 dizziness and nausea. PT will place pt on hold at this time, to allow further medical management, as pt is unable to tolerate any activities at this time. Pt reported the only relief he experienced was while taking prednisone taper, with dizziness returning 5 days after finishing taper. PT will resume therapy once severity of sx's has decreased.    Rehab Potential Good   Clinical Impairments Affecting Rehab Potential obesity might impact pt's ability to obtain testing/treatment positions   PT Frequency 2x / week   PT Duration 8 weeks   PT Treatment/Interventions ADLs/Self Care Home Management;Biofeedback;Canalith Repostioning;Neuromuscular re-education;Balance training;Therapeutic exercise;Therapeutic activities;Functional mobility training;Stair training;Gait training;DME Instruction;Patient/family education;Vestibular;Manual techniques   PT Next Visit Plan Place on hold   Consulted and Agree with Plan of Care Patient      Patient will benefit from skilled therapeutic intervention in order to improve the following deficits and impairments:  Abnormal gait, Decreased endurance, Obesity, Pain, Impaired flexibility, Dizziness, Decreased balance, Decreased mobility, Decreased knowledge of use of DME (PT will not directly address pain but will continue to monitor closely.)  Visit Diagnosis: Dizziness and giddiness     Problem List There are no active problems to display for this patient.   Miller,Jennifer  L 03/29/2016, 10:04 AM  [facility] [address] [place], [place], [address] Phone: [phone]   Fax:  [phone]  Name: Antonio Salazar MRN: [id number] Date of Birth: Sep 04, 1960  Zerita BoersJennifer Miller, PT,DPT 03/29/16 10:04 AM Phone: [phone] Fax: [phone]

## 2016-03-31 ENCOUNTER — Telehealth: Payer: Self-pay | Admitting: Rehabilitative and Restorative Service Providers"

## 2016-03-31 NOTE — Telephone Encounter (Signed)
Returned patient's phone call regarding faxing information to MD.  In inquiring about his status, he noted that going without meclizine led to worsening symptoms.  He reached out to Dr. Tracie HarrierBuyer's office.  "I'd love to get to treatment."  He has returned to taking the meclizine.  He notes it took him a day to get back to "where he was" after undergoing treatment on Tuesday.  "I want to get well.  I don't know what to do."  We discussed other options in pursuing treatment including: 1) beginning PT while on medication  2) prescribing graded motion sensitivity program *modified to within tolerance  Patient plans to call after speaking with MD office.  Earline Stiner, PT

## 2016-04-01 ENCOUNTER — Encounter: Payer: Managed Care, Other (non HMO) | Admitting: Rehabilitative and Restorative Service Providers"

## 2016-04-06 ENCOUNTER — Encounter: Payer: Managed Care, Other (non HMO) | Admitting: Rehabilitative and Restorative Service Providers"

## 2016-04-08 ENCOUNTER — Encounter: Payer: Managed Care, Other (non HMO) | Admitting: Rehabilitative and Restorative Service Providers"

## 2016-04-08 ENCOUNTER — Telehealth: Payer: Self-pay | Admitting: Rehabilitative and Restorative Service Providers"

## 2016-04-08 NOTE — Telephone Encounter (Signed)
PT returned call to Mr. Knighton and he notes that his physician cannot do anything further.    He requests PT fill out disability report.  PT educated patient that PTs typically do not fill out disabilty PW.  Will answer questions based on medical chart review as I have not met him/treated him.  He checked to ensure Claim # and fax # for disability PW correct.  PT to send per patient's request.  Rudell Cobb, PT

## 2016-04-11 ENCOUNTER — Ambulatory Visit: Payer: Commercial Managed Care - PPO | Admitting: Rehabilitative and Restorative Service Providers"

## 2016-04-11 DIAGNOSIS — R2689 Other abnormalities of gait and mobility: Secondary | ICD-10-CM

## 2016-04-11 DIAGNOSIS — R42 Dizziness and giddiness: Secondary | ICD-10-CM | POA: Diagnosis not present

## 2016-04-11 NOTE — Patient Instructions (Signed)
Gaze Stabilization: Tip Card  1.Target must remain in focus, not blurry, and appear stationary while head is in motion. 2.Perform exercises with small head movements (45 to either side of midline). 3.Increase speed of head motion so long as target is in focus. 4.If you wear eyeglasses, be sure you can see target through lens (therapist will give specific instructions for bifocal / progressive lenses). 5.These exercises may provoke dizziness or nausea. Work through these symptoms. If too dizzy, slow head movement slightly. Rest between each exercise. 6.Exercises demand concentration; avoid distractions.  Copyright  VHI. All rights reserved.    Gaze Stabilization: Sitting    Keeping eyes on target on wall 3-5 feet away, and move head side to side for  5 times. Repeat while moving head up and down for 5 times.  Work up to 10-20 repetitions as long as dizziness remains consistent with baseline. Do __2-3__ sessions per day.  Copyright  VHI. All rights reserved.    Head Motion: Side to Side    Sitting, tilt head down 15-30, slowly move head to right with eyes open. Then, move head slowly to opposite side.  Repeat __5__ times per session. Do __2-3__ sessions per day.  Copyright  VHI. All rights reserved.   Head Motion: Up and Down    Sitting, slowly move head up with eyes open.  Then, move head in opposite direction.  Repeat __5__ times per session. Do __2-3__ sessions per day.  Copyright  VHI. All rights reserved.   WALKING  Walking is a great form of exercise to increase your strength, endurance and overall fitness.  A walking program can help you start slowly and gradually build endurance as you go.  Everyone's ability is different, so each person's starting point will be different.  You do not have to follow them exactly.  The are just samples. You should simply find out what's right for you and stick to that program.   In the beginning, you'll start off walking 2-3  times a day for short distances.  As you get stronger, you'll be walking further at just 1-2 times per day.  A. You Can Walk For A Certain Length Of Time Each Day    Walk 3-5 minutes 3 times per day.  Increase 2 minutes every 5-7 days (3 times per day).  Work up to 25-30 minutes (1-2 times per day).   Example:   Day 1-2 4 minutes 3 times per day   Day 7-8 6-7 minutes 2-3 times per day   Day 13-14 10-12 minutes 1-2 times per day  B. You Can Walk For a Certain Distance Each Day     Distance can be substituted for time.    Example:   3 trips to mailbox (at road)   3 trips to corner of block   3 trips around the block   Special Instructions: Exercises may bring on mild to moderate symptoms of dizziness, neck tightness that resolve within 30 minutes of completing exercises. If symptoms are lasting longer than 30 minutes, modify your exercises by:  >decreasing the # of times you complete each activity >ensuring your symptoms return to baseline before moving onto the next exercise >dividing up exercises so you do not do them all in one session, but multiple short sessions throughout the day >doing them once a day until symptoms improve

## 2016-04-11 NOTE — Therapy (Signed)
[facility] [address] [place], [place], [address] Phone: [phone]   Fax:  [phone]  Physical Therapy Treatment  Patient Details  Name: Antonio Salazar MRN: [id number] Date of Birth: 09-Apr-1960 Referring Provider: Dr. Jearld Fenton, Dr. Glynn Octave  Encounter Date: 04/11/2016      PT End of Session - 04/11/16 1016    Visit Number 3   Number of Visits 17   Date for PT Re-Evaluation 05/24/16   Authorization Type [employer]-60 visit limit   PT Start Time 0938   PT Stop Time 1018   PT Time Calculation (min) 40 min   Activity Tolerance Other (comment)  limited by dizziness/ motion intolerance   Behavior During Therapy [facility] for tasks assessed/performed      No past medical history on file.  No past surgical history on file.  There were no vitals filed for this visit.      Subjective Assessment - 04/11/16 0939    Subjective The patient requests PT to speak with [employer] insurance agent to discuss short term disability (PT requests that we use our session for PT and he f/u with MD regarding short term disability).  Patient's daughter drove him here today.  At rest, he notes a constant level of dizziness of 4-5/10 "I've been eating these pills".  If he moves his head, dizziness increases to 7/10.  He describes a drunk sensation like he is going to puke that is worse in the morning when first getting up.  He is not doing any exercise at this time.  The patient's doctor did not need to see him again per phone conversation with MD.     Pertinent History No significant PMH    Patient Stated Goals "I want to be able to function again".    Currently in Pain? Yes   Pain Score 6    Pain Location Neck   Pain Orientation Upper  upper neck like being squeezed   Pain Descriptors / Indicators Aching;Squeezing   Pain Type Chronic pain   Pain Onset More than a month ago   Pain Frequency Constant   Aggravating Factors  happens with  ringing in ears   Pain Relieving Factors rest                Vestibular Assessment - 04/11/16 0944      Vestibular Assessment   General Observation Patient walked independently into clinic     Symptom Behavior   Type of Dizziness --  "feels like someone is trying to flip me over"   Frequency of Dizziness daily   Duration of Dizziness constant   Aggravating Factors Turning head quickly;Activity in general   Relieving Factors Head stationary;Rest     Occulomotor Exam   Occulomotor Alignment Abnormal  R eye mild hypertropia                 [facility] Adult PT Treatment/Exercise - 04/11/16 0946      Ambulation/Gait   Ambulation/Gait Yes   Ambulation/Gait Assistance 5: Supervision   Ambulation/Gait Assistance Details verbal cues for visual fixation on target with slowed pace during turns   Ambulation Distance (Feet) 250 Feet   Assistive device None   Ambulation Surface Level   Gait Comments slowed, cautious gait reaching for walls; PT cues to reduce dependence on external support to improve spatial awareness without somatosensory systems.      Self-Care   Self-Care Other Self-Care Comments   Other Self-Care Comments  The patient reports  he is sitting to wash dishes due to fear of falling and dizziness.  PT educated patient on plan for motion sensitivity and starting to move in order to get vestibular adaptation.       Exercises   Exercises Neck     Neck Exercises: Seated   Other Seated Exercise seated neck stretching including neck elongation with chin tuck, seated neck rotation.           Vestibular Treatment/Exercise - 04/11/16 0953      Vestibular Treatment/Exercise   Vestibular Treatment Provided Habituation;Gaze   Habituation Exercises Seated Horizontal Head Turns;Seated Vertical Head Turns;Brandt Daroff   Gaze Exercises X1 Viewing Horizontal;X1 Viewing Vertical     Austin Miles   Number of Reps  1   Symptom Description  modified with 3 pillows and  a wedge (at approx 50 deg angle); only did to the right side.      Seated Horizontal Head Turns   Number of Reps  5   Symptom Description  performed for mixed purpose of habituation and neck mobility  -- symptoms stay 7/10 during exercise     Seated Vertical Head Turns   Number of Reps  5   Symptom Description  performed for neck mobility and habituation patient reports "up and down is nice compared to side to side"     X1 Viewing Horizontal   Foot Position seated   Comments x 5 reps with cues for visual fixation at slow pace with dizziness rated 7/10     X1 Viewing Vertical   Foot Position seated   Comments x 5 reps with improved toleance to activity               PT Education - 04/11/16 1014    Education provided Yes   Education Details HEP: modified HEP for gaze, neck ROM, and walking program.     Person(s) Educated Patient   Methods Explanation;Demonstration;Handout   Comprehension Verbalized understanding;Returned demonstration;Verbal cues required;Tactile cues required          PT Short Term Goals - 03/25/16 1710      PT SHORT TERM GOAL #1   Title Pt will be IND in HEP to improve dizziness and balance. TARGET DATE FOR ALL STGS: 04/22/16   Status New     PT SHORT TERM GOAL #2   Title Pt will verbalize dizziness is 5/10 at worst during head turns, in order to improve safety during functional mobility.    Status New     PT SHORT TERM GOAL #3   Title Perform FGA as indicated and write goal.    Status New     PT SHORT TERM GOAL #4   Title Perform positional testing and write goals as indicated.   Status New           PT Long Term Goals - 03/25/16 1711      PT LONG TERM GOAL #1   Title Pt will improve gait speed to >/=2.52ft/sec to safely amb. in the community. TARGET DATE: 05/20/16   Status New     PT LONG TERM GOAL #2   Title Pt will amb. 500', IND, over even/uneven terrain, with </=1/10 dizziness while performing head turns in order to improve  functional mobility.    Status New     PT LONG TERM GOAL #3   Title Pt will will improve DHI score to >/=72% to improve quality of life.    Status New  Plan - 04/11/16 1108    Clinical Impression Statement The patient presents today with continued constant sensation of dizziness rated 4-5/10 that increases to 7/10 with only minimal movement.  He also notes taking meclizine frequently for symptom mgmt.  PT explained goals of activities to include initiating movement for HEP, habituation, and vestibular adaptation.  Also recommended general walking to decrease sedentary behaviors and promote improved functional mobility.  The patient tolerated modified motion sensitivity program with education on purpose to improve compliance.  Patient agrees to current plan.    PT Treatment/Interventions ADLs/Self Care Home Management;Biofeedback;Canalith Repostioning;Neuromuscular re-education;Balance training;Therapeutic exercise;Therapeutic activities;Functional mobility training;Stair training;Gait training;DME Instruction;Patient/family education;Vestibular;Manual techniques   PT Next Visit Plan Check motion sensitivity HEP and progress vestibular/gaze adaptation exercises as tolerated.  Due to extreme motion sensitvity, may reduce to 1x/week if we find patient not able to progress between sessions when scheduled 2x/week.    Consulted and Agree with Plan of Care Patient      Patient will benefit from skilled therapeutic intervention in order to improve the following deficits and impairments:  Abnormal gait, Decreased endurance, Obesity, Pain, Impaired flexibility, Dizziness, Decreased balance, Decreased mobility, Decreased knowledge of use of DME  Visit Diagnosis: Dizziness and giddiness  Other abnormalities of gait and mobility     Problem List There are no active problems to display for this patient.   Peyten Punches, PT 04/11/2016, 11:52 AM  [facility] [address] [place], [place], [address] Phone: [phone]   Fax:  [phone]  Name: Antonio Salazar MRN: [id number] Date of Birth: 11-11-1960

## 2016-04-13 ENCOUNTER — Encounter: Payer: Managed Care, Other (non HMO) | Admitting: Rehabilitative and Restorative Service Providers"

## 2016-04-13 ENCOUNTER — Telehealth: Payer: Self-pay | Admitting: Rehabilitative and Restorative Service Providers"

## 2016-04-13 ENCOUNTER — Ambulatory Visit: Payer: Commercial Managed Care - PPO | Admitting: Rehabilitative and Restorative Service Providers"

## 2016-04-13 NOTE — Telephone Encounter (Signed)
The patient called requesting information from PT [facility] (Amy).  He reports "any communication would help."   PT reviewed role and that we don't fill out PW for short term disability.  The physician makes that determination based on medical mgmt and notes from therapy.  The exercises appear to help he notes.  He reports he is still taking meclizine regularly.  He reports "I get bad" when he goes without the meclizine.  Patient reports he plans to call here and his doctors every other day to check on status of short term disability.  Braheem Tomasik, PT

## 2016-04-15 ENCOUNTER — Telehealth: Payer: Self-pay | Admitting: Rehabilitative and Restorative Service Providers"

## 2016-04-15 ENCOUNTER — Ambulatory Visit: Payer: Commercial Managed Care - PPO | Admitting: Rehabilitative and Restorative Service Providers"

## 2016-04-15 ENCOUNTER — Encounter: Payer: Managed Care, Other (non HMO) | Admitting: Rehabilitative and Restorative Service Providers"

## 2016-04-15 NOTE — Telephone Encounter (Signed)
PT called Antonio Salazar due to a no show for his visit today.  He slept past visit time.  Also recommended any further questions regarding paperwork be directed at front office staff as they are aware of hospital procedures for releasing information.  Tullio Chausse, PT

## 2016-04-20 ENCOUNTER — Ambulatory Visit: Payer: Commercial Managed Care - PPO | Admitting: Rehabilitative and Restorative Service Providers"

## 2016-04-20 ENCOUNTER — Encounter: Payer: Managed Care, Other (non HMO) | Admitting: Rehabilitative and Restorative Service Providers"

## 2016-04-20 VITALS — BP 126/78

## 2016-04-20 DIAGNOSIS — R2689 Other abnormalities of gait and mobility: Secondary | ICD-10-CM

## 2016-04-20 DIAGNOSIS — R42 Dizziness and giddiness: Secondary | ICD-10-CM | POA: Diagnosis not present

## 2016-04-20 NOTE — Patient Instructions (Signed)
Levator Stretch    Hold the chair with your right hand, turn your head to the left then look down past your armpit.  DON'T HOLD YOUR HEAD.  Repeat on both sides. Hold stretch for 15 seconds.  Repeat 3 times. Perform 2-3 session per day.  Shoulder Rolls   Move your shoulders in a circular pattern as shown so that your are moving in an up, back and down direction. Perform small cicles if needed for comfort.  2 sets of 10 reps.  Perform 2 sessions per day.

## 2016-04-20 NOTE — Therapy (Signed)
[facility] [address] [place], [place], [address] Phone: [phone]   Fax:  [phone]  Physical Therapy Treatment  Patient Details  Name: Antonio Salazar MRN: [id number] Date of Birth: [birth year] Referring Provider: Dr. Jearld FentonByers, Dr. Glynn OctaveStephen Rancour  Encounter Date: 04/20/2016      PT End of Session - 04/20/16 1110    Visit Number 4   Number of Visits 17   Date for PT Re-Evaluation 05/24/16   Authorization Type [employer]-60 visit limit   PT Start Time 1102   PT Stop Time 1145   PT Time Calculation (min) 43 min   Activity Tolerance Other (comment)  limited by dizziness/ motion intolerance   Behavior [facility] Therapy [facility] for tasks assessed/performed      No past medical history on file.  No past surgical history on file.  Vitals:   04/20/16 1104  BP: 126/78        Subjective Assessment - 04/20/16 1104    Subjective The patient had another "attack" of vertigo on Saturday night.  He notes he couldn't see or hear "It was the same thing that happened to me when I went to the hospital."  He reports that he is just now coming out of the attack of vertigo from the weekend.  He called Dr. Tona SensingByer's office due to attack of vertigo and they noted they have done all of the testing they can do for vertigo.  Sallye OberLouise, PA at ENT office, called patient and prescribed valium and meclizine to treat.  The patient is wondering if water pills are creating increased dizziness.   The patient reports Saturday night, he notes spinning, dec'd ability to turn his head.  He is still c/o neck pain after onset describing pressure in his head.   Did not take meclizine or valium today.   Pertinent History Patient notes continued swelling in his ankles.     Patient Stated Goals "I want to be able to function again".    Currently in Pain? Yes   Pain Score 6   notes pain in neck and head   Pain Location Neck   Pain Orientation Right   Pain Descriptors  / Indicators Aching;Stabbing;Squeezing   Pain Type Chronic pain   Pain Onset More than a month ago   Pain Frequency Constant   Aggravating Factors  intermittent in nature   Pain Relieving Factors rest                Vestibular Assessment - 04/20/16 1110      Vestibular Assessment   General Observation Patient is taking meclizine and valium (to sleep).  He continues with a rocking sensation with environmental movement.  He notes impatience with process of therapy and needs redirection to continue through exercises.   Patient notes baseline is 5-6/10 dizziness.       Positional Testing   Sidelying Test Sidelying Right;Sidelying Left     Sidelying Right   Sidelying Right Duration 2 minutes noting subjective reports of room is moving and motion sickness rated 8/10   Sidelying Right Symptoms No nystagmus     Sidelying Left   Sidelying Left Duration 1 minute of sensation of feeling pulled off the bed- no nystagmus viewed in room light.  Provokes 8/10 symptoms.   Sidelying Left Symptoms No nystagmus                 [facility] Adult PT Treatment/Exercise - 04/20/16 1118      Exercises  Exercises Neck     Neck Exercises: Seated   Shoulder Rolls 5 reps   Other Seated Exercise levator scapulae stretching in sitting   Other Seated Exercise AROM in sitting with patient noting discomfort with R rotation / L rotation improved after suboccipital release and manual techniques     Manual Therapy   Manual Therapy Soft tissue mobilization;Manual Traction   Manual therapy comments Patient initially c/o significant pain with palpation of suboccipitals on R side.  With contract/relax, manual traction and soft tissue mobilization, he noted resolution of sharp pain and rated 0/10 after manual therapy.   Soft tissue mobilization suboccipitals, upper trap, scalenes   Manual Traction gentle manual traction for soft tissue relaxation         Vestibular Treatment/Exercise - 04/20/16 1118       Vestibular Treatment/Exercise   Vestibular Treatment Provided Habituation;Gaze   Habituation Exercises Seated Horizontal Head Turns;Brandt Daroff     Brandt Daroff   Number of Reps  2   Symptom Description  patient is only able to maintain right or left sidelying x 1 minute before reporting 8/10.  PT had to encourage rest in between to ensure symptoms return to baseline.      Seated Horizontal Head Turns   Number of Reps  5   Symptom Description  Patient notes symptoms increase with all movement     Seated Vertical Head Turns   Number of Reps  --     X1 Viewing Horizontal   Foot Position Recommended seated VOR continue in HEP.                PT Education - 04/20/16 2008    Education provided Yes   Education Details levator stretch and shoulder circles for neck mobility   Person(s) Educated Patient   Methods Explanation;Demonstration;Handout   Comprehension Verbalized understanding;Returned demonstration          PT Short Term Goals - 03/25/16 1710      PT SHORT TERM GOAL #1   Title Pt will be IND in HEP to improve dizziness and balance. TARGET DATE FOR ALL STGS: 04/22/16   Status New     PT SHORT TERM GOAL #2   Title Pt will verbalize dizziness is 5/10 at worst during head turns, in order to improve safety during functional mobility.    Status New     PT SHORT TERM GOAL #3   Title Perform FGA as indicated and write goal.    Status New     PT SHORT TERM GOAL #4   Title Perform positional testing and write goals as indicated.   Status New           PT Long Term Goals - 03/25/16 1711      PT LONG TERM GOAL #1   Title Pt will improve gait speed to >/=2.108ft/sec to safely amb. in the community. TARGET DATE: 05/20/16   Status New     PT LONG TERM GOAL #2   Title Pt will amb. 500', IND, over even/uneven terrain, with </=1/10 dizziness while performing head turns in order to improve functional mobility.    Status New     PT LONG TERM GOAL #3   Title  Pt will will improve DHI score to >/=72% to improve quality of life.    Status New               Plan - 04/20/16 1145    Clinical Impression Statement The patient has dec'd  subjective rating of dizziness today noting 5/10 symptoms reporting a better day.  He arrived with significant headache described as neck pain and pressure.  It responded well to manual techniques and patient noted resolution of symptoms.   The patient notes neck worsened approximately 68month after onset of dizziness and dec'd head motion may be leading to neck limitations that are exacerbating symptoms.  The patient is tolerating therapy modified for current level.  It will be important as he improves tolerance to activity for him to work with MDs to reduce reliance on vestibular supressants to optimize his response to physical therapy/vestibular rehabilitation.    PT Treatment/Interventions ADLs/Self Care Home Management;Biofeedback;Canalith Repostioning;Neuromuscular re-education;Balance training;Therapeutic exercise;Therapeutic activities;Functional mobility training;Stair training;Gait training;DME Instruction;Patient/family education;Vestibular;Manual techniques   PT Next Visit Plan Progress VOR to standing, neck manual as needed.  treat 1x/week to allow for time to adjust to exercises.   Consulted and Agree with Plan of Care Patient      Patient will benefit from skilled therapeutic intervention in order to improve the following deficits and impairments:  Abnormal gait, Decreased endurance, Obesity, Pain, Impaired flexibility, Dizziness, Decreased balance, Decreased mobility, Decreased knowledge of use of DME  Visit Diagnosis: Dizziness and giddiness  Other abnormalities of gait and mobility     Problem List There are no active problems to display for this patient.   Betzy Barbier, PT 04/20/2016, 9:28 PM  [facility] [address] [place], [place], [address] Phone: [phone]   Fax:  [phone]  Name: Jaivian Battaglini MRN: [id number] Date of Birth: 04/17/1960

## 2016-04-22 ENCOUNTER — Encounter: Payer: Managed Care, Other (non HMO) | Admitting: Rehabilitative and Restorative Service Providers"

## 2016-04-22 ENCOUNTER — Ambulatory Visit: Payer: Commercial Managed Care - PPO | Admitting: Rehabilitative and Restorative Service Providers"

## 2016-04-25 ENCOUNTER — Ambulatory Visit: Payer: Commercial Managed Care - PPO | Admitting: Rehabilitative and Restorative Service Providers"

## 2016-05-02 ENCOUNTER — Ambulatory Visit
Payer: Commercial Managed Care - PPO | Attending: Physician Assistant | Admitting: Rehabilitative and Restorative Service Providers"

## 2016-05-02 DIAGNOSIS — R42 Dizziness and giddiness: Secondary | ICD-10-CM | POA: Diagnosis not present

## 2016-05-02 DIAGNOSIS — R2689 Other abnormalities of gait and mobility: Secondary | ICD-10-CM | POA: Diagnosis present

## 2016-05-02 NOTE — Therapy (Signed)
[facility] [address] [place], [place], [address] Phone: [phone]   Fax:  [phone]  Physical Therapy Treatment  Patient Details  Name: Antonio Salazar MRN: [id number] Date of Birth: 09/26/1960 Referring Provider: Dr. Janace Hoard, Dr. Ezequiel Essex  Encounter Date: 05/02/2016      PT End of Session - 05/02/16 1207    Visit Number 5   Number of Visits 17   Date for PT Re-Evaluation 05/24/16   Authorization Type [employer]-60 visit limit   PT Start Time 1025   PT Stop Time 1105   PT Time Calculation (min) 40 min   Activity Tolerance Other (comment)  limited by dizziness/ motion intolerance   Behavior During Therapy [facility] for tasks assessed/performed      No past medical history on file.  No past surgical history on file.  There were no vitals filed for this visit.      Subjective Assessment - 05/02/16 1025    Subjective The patient reports dizziness is the same.  He is doing exercises and still taking meclizine (taking 1 per day and using extra as needed).  He notes short term disability got PT notes (patient faxed over).   He reports that he got an appeal through short term disability.    Unable to sleep in bed, he has tried it per report.    Patient Stated Goals "I want to be able to function again".    Currently in Pain? Yes   Pain Score 6    Pain Location Neck   Pain Orientation Right   Pain Descriptors / Indicators Aching;Squeezing;Stabbing   Pain Type Chronic pain   Pain Onset More than a month ago   Pain Frequency Constant   Aggravating Factors  intermittent in nature   Pain Relieving Factors felt improvement day of last session with manual techniques.                Vestibular Assessment - 05/02/16 1031      Vestibular Assessment   General Observation Baseline level of dizziness 5-6/10 described as "lightheadedness", worse with movement and with coughing.                   [place]  Adult PT Treatment/Exercise - 05/02/16 0001      Ambulation/Gait   Ambulation/Gait Yes   Ambulation/Gait Assistance 6: Modified independent (Device/Increase time)   Gait Comments Ambulation x 250 feet x 3 reps working on general mobility to increase overall tolerance to movement.      Self-Care   Self-Care Other Self-Care Comments   Other Self-Care Comments  Discussed walking for HEP and updated HEP.      Neck Exercises: Supine   Other Supine Exercise passive stretching supine for neck lateral flexion, levator stretch, neck rotation.           Vestibular Treatment/Exercise - 05/02/16 1030      Vestibular Treatment/Exercise   Vestibular Treatment Provided Habituation;Gaze   Habituation Exercises Standing Horizontal Head Turns;Standing Vertical Head Turns;Brandt Daroff   Gaze Exercises X1 Viewing Horizontal;X1 Viewing Vertical     Nestor Lewandowsky   Number of Reps  2   Symptom Description  2 pillows to left side sensation of "someone's trying to push me over".  R side provokes a sensation of "annoying" sensation worse on R side.  To the right side, rates 7-8/10 symptoms, worse with return to sitting.      Standing Horizontal Head Turns   Number of Reps  5   Symptom Description  eyes moving with head at self regulated pace with wide base of support in corner to lean back as needed.  Patient rests to let symptoms settle for 1 minute post     Standing Vertical Head Turns   Number of Reps  5   Symptom Description  Eyes moving with head.  Has to use intermittent UE support through walls while in corner.      X1 Viewing Horizontal   Foot Position Standing VOR with feet apart   Comments 10 reps at slow pace, then recommended decreased ROM with faster side to side motion.  Performed x 10 reps with intermittent UE support     X1 Viewing Vertical   Foot Position standing   Comments Easier than horizontal with intermittent UE Support.                PT Education - 05/02/16 1207     Education provided Yes   Education Details Modified HEP to be standing for habituation and gaze x 1 viewing   Person(s) Educated Patient   Methods Explanation;Demonstration;Handout   Comprehension Verbalized understanding;Returned demonstration          PT Short Term Goals - 05/02/16 1208      PT SHORT TERM GOAL #1   Title Pt will be IND in HEP to improve dizziness and balance. TARGET DATE FOR ALL STGS: 04/22/16   Baseline Met on 05/02/16 with habituation and gaze HEP.    Status Achieved     PT SHORT TERM GOAL #2   Title Pt will verbalize dizziness is 5/10 at worst during head turns, in order to improve safety during functional mobility.    Baseline Baseline dizziness is 5-6/10, however dizziness increased to 7/10 with head motion.    Status Partially Met     PT SHORT TERM GOAL #3   Title Perform FGA as indicated and write goal.    Baseline Modify target date to LTGs*   Status Revised     PT SHORT TERM GOAL #4   Title Perform positional testing and write goals as indicated.   Baseline Patient continues with 8/10 symptoms with sidelying.   Status Achieved           PT Long Term Goals - 03/25/16 1711      PT LONG TERM GOAL #1   Title Pt will improve gait speed to >/=2.18f/sec to safely amb. in the community. TARGET DATE: 05/20/16   Status New     PT LONG TERM GOAL #2   Title Pt will amb. 500', IND, over even/uneven terrain, with </=1/10 dizziness while performing head turns in order to improve functional mobility.    Status New     PT LONG TERM GOAL #3   Title Pt will will improve DHI score to >/=72% to improve quality of life.    Status New               Plan - 05/02/16 1056    Clinical Impression Statement The patient tolerates increased movement today, although he still notes same baseline level of dizziness of 5-6/10.   The patient notes continued neck pain, but has full ROM.  The patient does note taking less meclizine.  PT continuing to encourage  increased activity for vestibular adaptation.    PT Treatment/Interventions ADLs/Self Care Home Management;Biofeedback;Canalith Repostioning;Neuromuscular re-education;Balance training;Therapeutic exercise;Therapeutic activities;Functional mobility training;Stair training;Gait training;DME Instruction;Patient/family education;Vestibular;Manual techniques   PT Next Visit Plan Check FGA:  Progress habituation  exercises, neck manual as indicated, work towards STGs/LTGs.    Consulted and Agree with Plan of Care Patient      Patient will benefit from skilled therapeutic intervention in order to improve the following deficits and impairments:  Abnormal gait, Decreased endurance, Obesity, Pain, Impaired flexibility, Dizziness, Decreased balance, Decreased mobility, Decreased knowledge of use of DME  Visit Diagnosis: Dizziness and giddiness  Other abnormalities of gait and mobility     Problem List There are no active problems to display for this patient.   [place], PT 05/02/2016, 2:22 PM  [facility] [address] [place], [place], [address] Phone: [phone]   Fax:  [phone]  Name: Dawood Spitler MRN: [id number] Date of Birth: 1960/08/28

## 2016-05-02 NOTE — Patient Instructions (Signed)
Gaze Stabilization - Tip Card  1.Target must remain in focus, not blurry, and appear stationary while head is in motion. 2.Perform exercises with small head movements (45 to either side of midline). 3.Increase speed of head motion so long as target is in focus. 4.If you wear eyeglasses, be sure you can see target through lens (therapist will give specific instructions for bifocal / progressive lenses). 5.These exercises may provoke dizziness or nausea. Work through these symptoms. If too dizzy, slow head movement slightly. Rest between each exercise. 6.Exercises demand concentration; avoid distractions. 7.For safety, perform standing exercises close to a counter, wall, corner, or next to someone.  Copyright  VHI. All rights reserved.   Gaze Stabilization - Standing Feet Apart   Feet shoulder width apart, keeping eyes on target on wall 3 feet away, tilt head down slightly and move head side to side for 30 seconds. Repeat while moving head up and down for 30 seconds. *Work up to tolerating 60 seconds, as able. Do 2-3 sessions per day.   Copyright  VHI. All rights reserved.   Feet Apart, Head Motion - Eyes Open    With eyes open, feet apart, move head slowly: up and down x 5 reps.  Rest and let symptoms settle.  Repeat side to side x 5 reps. Repeat __2__ times per session. Do _2___ sessions per day.  Copyright  VHI. All rights reserved.

## 2016-05-11 ENCOUNTER — Ambulatory Visit: Payer: Commercial Managed Care - PPO | Admitting: Rehabilitative and Restorative Service Providers"

## 2016-05-11 VITALS — BP 111/75

## 2016-05-11 DIAGNOSIS — R42 Dizziness and giddiness: Secondary | ICD-10-CM | POA: Diagnosis not present

## 2016-05-11 NOTE — Therapy (Signed)
[facility] [address] [place], [place], [address] Phone: [phone]   Fax:  [phone]  Physical Therapy Treatment  Patient Details  Name: Antonio Salazar MRN: [id number] Date of Birth: 12/22/1960 Referring Provider: Dr. Janace Hoard, Dr. Ezequiel Essex  Encounter Date: 05/11/2016      PT End of Session - 05/11/16 1221    Visit Number 6   Number of Visits 17   Date for PT Re-Evaluation 05/24/16   Authorization Type [employer]-60 visit limit   PT Start Time 1150   PT Stop Time 1210   PT Time Calculation (min) 20 min   Activity Tolerance Other (comment)  limited by dizziness/ motion intolerance   Behavior During Therapy [facility] for tasks assessed/performed      No past medical history on file.  No past surgical history on file.  Vitals:   05/11/16 1221  BP: 111/75        Subjective Assessment - 05/11/16 1155    Subjective The patient reports he is not doing well.  He had onset of worsening symptoms early morning 2/20 noting R ear discomfort with burning sensation, "splitting headache you wouldn't believe", "I feel like I want to cry; it's killing me".  He saw cardiologist last week and doubled up on fluid pills.  He feels there is a relationship to dizziness and fluid pills.   The patient notes 4-5/10 baseline dizziness and vision bothers him.     Pertinent History Patient notes continued swelling in his ankles.     Patient Stated Goals "I want to be able to function again".    Currently in Pain? No/denies  neck pain resolved                Vestibular Assessment - 05/11/16 1206      Vestibular Assessment   General Observation Symptoms are not severe spinning at this time.  His cc: are headache and R ear pain without severe dizziness, although still rates a baseline 4-5/10 level of dizziness.  "Neck pain is gone".                 [facility] Adult PT Treatment/Exercise - 05/11/16 1210      Self-Care   Self-Care Other Self-Care Comments   Other Self-Care Comments  Recommended patient discuss onset of HA with MD and return to PT habituation and gaze exercises as able to tolerate.  He cannot tolerate activities well today due to headache and sensation of ear infection.    Discussed benefits of walking and working through symptoms to tolerance.                  PT Short Term Goals - 05/02/16 1208      PT SHORT TERM GOAL #1   Title Pt will be IND in HEP to improve dizziness and balance. TARGET DATE FOR ALL STGS: 04/22/16   Baseline Met on 05/02/16 with habituation and gaze HEP.    Status Achieved     PT SHORT TERM GOAL #2   Title Pt will verbalize dizziness is 5/10 at worst during head turns, in order to improve safety during functional mobility.    Baseline Baseline dizziness is 5-6/10, however dizziness increased to 7/10 with head motion.    Status Partially Met     PT SHORT TERM GOAL #3   Title Perform FGA as indicated and write goal.    Baseline Modify target date to LTGs*   Status Revised     PT  SHORT TERM GOAL #4   Title Perform positional testing and write goals as indicated.   Baseline Patient continues with 8/10 symptoms with sidelying.   Status Achieved           PT Long Term Goals - 03/25/16 1711      PT LONG TERM GOAL #1   Title Pt will improve gait speed to >/=2.70f/sec to safely amb. in the community. TARGET DATE: 05/20/16   Status New     PT LONG TERM GOAL #2   Title Pt will amb. 500', IND, over even/uneven terrain, with </=1/10 dizziness while performing head turns in order to improve functional mobility.    Status New     PT LONG TERM GOAL #3   Title Pt will will improve DHI score to >/=72% to improve quality of life.    Status New               Plan - 05/11/16 1216    Clinical Impression Statement PT intervention limited today due to presence of severe "splitting" headache and new onset/recurrence of R ear pain.  He rates baseline  "dizziness" 4-5/10 described as sensation of his head moving (not environment).  He has noted improved neck ROM and reduced pain with habituation exercises, but is not able to tolerate with headache.  PT to reassess at next session.    PT Treatment/Interventions ADLs/Self Care Home Management;Biofeedback;Canalith Repostioning;Neuromuscular re-education;Balance training;Therapeutic exercise;Therapeutic activities;Functional mobility training;Stair training;Gait training;DME Instruction;Patient/family education;Vestibular;Manual techniques   PT Next Visit Plan Check FGA:  Progress habituation exercises, neck manual as indicated, work towards STGs/LTGs.    Consulted and Agree with Plan of Care Patient      Patient will benefit from skilled therapeutic intervention in order to improve the following deficits and impairments:  Abnormal gait, Decreased endurance, Obesity, Pain, Impaired flexibility, Dizziness, Decreased balance, Decreased mobility, Decreased knowledge of use of DME  Visit Diagnosis: No diagnosis found.     Problem List There are no active problems to display for this patient.   [place] PT 05/11/2016, 12:23 PM  [place] [address]Phone: [phone]  Fax:  [phone] Name: Antonio MontourMRN: [id number] of Birth: 203-11-1960

## 2016-05-16 ENCOUNTER — Ambulatory Visit: Payer: Commercial Managed Care - PPO | Admitting: Rehabilitative and Restorative Service Providers"

## 2016-05-23 ENCOUNTER — Ambulatory Visit
Payer: Commercial Managed Care - PPO | Attending: Physician Assistant | Admitting: Rehabilitative and Restorative Service Providers"

## 2016-05-23 DIAGNOSIS — R2689 Other abnormalities of gait and mobility: Secondary | ICD-10-CM | POA: Insufficient documentation

## 2016-05-23 DIAGNOSIS — R42 Dizziness and giddiness: Secondary | ICD-10-CM | POA: Insufficient documentation

## 2016-05-26 ENCOUNTER — Telehealth: Payer: Self-pay | Admitting: Rehabilitative and Restorative Service Providers"

## 2016-05-26 ENCOUNTER — Ambulatory Visit: Payer: Commercial Managed Care - PPO | Admitting: Rehabilitative and Restorative Service Providers"

## 2016-05-26 DIAGNOSIS — R42 Dizziness and giddiness: Secondary | ICD-10-CM

## 2016-05-26 DIAGNOSIS — R2689 Other abnormalities of gait and mobility: Secondary | ICD-10-CM | POA: Diagnosis present

## 2016-05-26 NOTE — Telephone Encounter (Signed)
Sallye OberLouise, Mr. Demaris CallanderDecesare would benefit from a barriatric SPC in order to walk longer distances.  He reports a fear of losing his balance due to dizziness and thinks this would help him increase his home activity level.  Please [facility] an [facility] via fax at [phone]) [phone] Attn:  Margretta Dittyhristina Kenasia Scheller, PT  Thank you, Margretta Dittyhristina Deone Leifheit, MPT

## 2016-05-26 NOTE — Therapy (Signed)
[facility] [address] [place], [place], [address] Phone: [phone]   Fax:  [phone]  Physical Therapy Treatment  Patient Details  Name: Antonio Salazar MRN: [id number] Date of Birth: [birth year] Referring Provider: Dr. Janace Hoard, Dr. Ezequiel Essex  Encounter Date: 05/26/2016      PT End of Session - 05/26/16 1012    Visit Number 7   Number of Visits 17   Date for PT Re-Evaluation 06/24/16   Authorization Type Aetna-60 visit limit   PT Start Time 0936   PT Stop Time 1015   PT Time Calculation (min) 39 min   Activity Tolerance Other (comment)  limited by dizziness/ motion intolerance   Behavior During Therapy [facility] for tasks assessed/performed      No past medical history on file.  No past surgical history on file.  There were no vitals filed for this visit.      Subjective Assessment - 05/26/16 0939    Subjective The patient reports that he has followed up with ENT by phone and therapy is where they want him to be now.  He is having a rash from lasix and is following up with edema and fluid mgmt.   Baseline level of dizziness is 5-6/10.  He notes it is constant and the tinnitus is also constant.  "I want something to steady myself during walking to be able to go further."  "Is there anybody I can talk to psychologically about this?"   Pertinent History Patient notes continued swelling in his ankles.     Patient Stated Goals "I want to be able to function again".    Currently in Pain? No/denies            [facility] PT Assessment - 05/26/16 0945      Ambulation/Gait   Ambulation/Gait Yes   Ambulation/Gait Assistance 6: Modified independent (Device/Increase time)   Ambulation/Gait Assistance Details slowed pace added SPC as patient reports he is avoiding walking longer distances due to getting unsteady as he goes   Ambulation Distance (Feet) 300 Feet  3 minutes at a time, then rests due to dizziness/nausea    Assistive device None;Straight cane  barriatric cane   Ambulation Surface Level   Gait Comments Notes a "heightened level" of dizziness with walking.     Functional Gait  Assessment   Gait assessed  Yes   Gait Level Surface Walks 20 ft, slow speed, abnormal gait pattern, evidence for imbalance or deviates 10-15 in outside of the 12 in walkway width. Requires more than 7 sec to ambulate 20 ft.   Change in Gait Speed Makes only minor adjustments to walking speed, or accomplishes a change in speed with significant gait deviations, deviates 10-15 in outside the 12 in walkway width, or changes speed but loses balance but is able to recover and continue walking.   Gait with Horizontal Head Turns Performs head turns with moderate changes in gait velocity, slows down, deviates 10-15 in outside 12 in walkway width but recovers, can continue to walk.   Gait with Vertical Head Turns Performs task with moderate change in gait velocity, slows down, deviates 10-15 in outside 12 in walkway width but recovers, can continue to walk.   Gait and Pivot Turn Pivot turns safely in greater than 3 sec and stops with no loss of balance, or pivot turns safely within 3 sec and stops with mild imbalance, requires small steps to catch balance.   Step Over Obstacle Is able to step  over one shoe box (4.5 in total height) but must slow down and adjust steps to clear box safely. May require verbal cueing.   Gait with Narrow Base of Support Ambulates less than 4 steps heel to toe or cannot perform without assistance.   Gait with Eyes Closed Cannot walk 20 ft without assistance, severe gait deviations or imbalance, deviates greater than 15 in outside 12 in walkway width or will not attempt task.   Ambulating Backwards Walks 20 ft, slow speed, abnormal gait pattern, evidence for imbalance, deviates 10-15 in outside 12 in walkway width.   Steps Two feet to a stair, must use rail.   Total Score 9   FGA comment: 9/30             Vestibular Assessment - 05/26/16 0945      Vestibular Assessment   General Observation Patient notes "dizzy" and the more I move, "it keeps pissing it off".     Symptom Behavior   Type of Dizziness Spinning                 [facility] Adult PT Treatment/Exercise - 05/26/16 0945      Self-Care   Self-Care Other Self-Care Comments   Other Self-Care Comments  Recommended more frequent ambulation, more frequent participation in HEP, and discussing reduction of meclizine use with MD for his system to improve ability to adapt.          Vestibular Treatment/Exercise - 05/26/16 1002      Vestibular Treatment/Exercise   Vestibular Treatment Provided Habituation;Gaze   Habituation Exercises Standing Horizontal Head Turns;Standing Vertical Head Turns;Brandt Daroff   Gaze Exercises X1 Viewing Horizontal;X1 Viewing Vertical     Standing Horizontal Head Turns   Symptom Description  during gait activities     Standing Vertical Head Turns   Symptom Description  during gait activities     X1 Viewing Horizontal   Foot Position reviewed in seated position   Comments 20 seconds with cues on increasing speed of movement.     X1 Viewing Vertical   Foot Position reviewed in seated                 PT Short Term Goals - 05/02/16 1208      PT SHORT TERM GOAL #1   Title Pt will be IND in HEP to improve dizziness and balance. TARGET DATE FOR ALL STGS: 04/22/16   Baseline Met on 05/02/16 with habituation and gaze HEP.    Status Achieved     PT SHORT TERM GOAL #2   Title Pt will verbalize dizziness is 5/10 at worst during head turns, in order to improve safety during functional mobility.    Baseline Baseline dizziness is 5-6/10, however dizziness increased to 7/10 with head motion.    Status Partially Met     PT SHORT TERM GOAL #3   Title Perform FGA as indicated and write goal.    Baseline Modify target date to LTGs*   Status Revised     PT SHORT TERM GOAL #4   Title Perform  positional testing and write goals as indicated.   Baseline Patient continues with 8/10 symptoms with sidelying.   Status Achieved           PT Long Term Goals - 05/26/16 1011      PT LONG TERM GOAL #1   Title Pt will improve gait speed to >/=2.57f/sec to safely amb. in the community.   Baseline MODIFIED TARGET DATE IS 06/26/16  Status Revised     PT LONG TERM GOAL #2   Title Pt will amb. 500', IND, over even/uneven terrain, with </=1/10 dizziness while performing head turns in order to improve functional mobility.    Baseline MODIFIED TARGET DATE IS 06/26/16   Status Revised     PT LONG TERM GOAL #3   Title Pt will will improve DHI score to >/=72% to improve quality of life.    Baseline MODIFIED TARGET DATE IS 06/26/16   Status Revised     PT LONG TERM GOAL #4   Title IMprove FGA from 9/30 to > or equal to 14/30 to demo improving dynamic activity tolerance.   Baseline Target date 06/26/16   Time 4   Period Weeks               Plan - 05/26/16 1527    Clinical Impression Statement The patient has been seen for 7 visits in physical therapy.  He continues to c/o baseline level of dizziness, intermittent HA, other medical issues (rash, anxiety, shortness of breath).  His progress has been hindered by dec'd participation in HEP due to other medical factors.  PT and patient discussed that he has to perform these activities to adapt to dizziness.  He feels that a cane may be helpful _will reach out to Dorene Sorrow, [place] for prescription for barriatic SPC.   Patient is at fall risk per FGA score.    PT Next Visit Plan progress walking tolerance, movement during sessions, further progress gaze in standing and during walking   Consulted and Agree with Plan of Care Patient      Patient will benefit from skilled therapeutic intervention in order to improve the following deficits and impairments:     Visit Diagnosis: Dizziness and giddiness  Other abnormalities of gait and  mobility     Problem List There are no active problems to display for this patient.   [place], [place] 05/26/2016, 3:33 PM  [facility] [address] [place], [place], [address] Phone: [phone]   Fax:  [phone]  Name: Antonio Salazar MRN: [id number] Date of Birth: 1961-01-22

## 2016-06-08 ENCOUNTER — Ambulatory Visit: Payer: Commercial Managed Care - PPO | Admitting: Rehabilitative and Restorative Service Providers"

## 2016-06-08 DIAGNOSIS — R42 Dizziness and giddiness: Secondary | ICD-10-CM

## 2016-06-08 NOTE — Therapy (Signed)
[place] [address] [place], [place], [address] Phone: [phone]   Fax:  [phone]  Physical Therapy Treatment  Patient Details  Name: Antonio Salazar MRN: [id number] Date of Birth: Aug 20, 1960 Referring Physician:  Dr. Clent Ridges.  Encounter Date: 06/08/2016      PT End of Session - 06/08/16 1427    Visit Number 8   Number of Visits 17   Date for PT Re-Evaluation 06/24/16   Authorization Type [employer]-60 visit limit   PT Start Time 2706   PT Stop Time 1255   PT Time Calculation (min) 20 min   Activity Tolerance Treatment limited secondary to medical complications (Comment)   Behavior During Therapy [facility] for tasks assessed/performed      No past medical history on file.  No past surgical history on file.  There were no vitals filed for this visit.      Subjective Assessment - 06/08/16 1235    Subjective The patient reports edema is getting worse in legs and he is getting scheduled with cardiologist due to fluid.  He has fluid noted in right leg greater than left leg.  He has fluid on socks and notes he just changed these one hour ago.   The patient notes continued insomnia -- unable to sleep at all.  He has baseline, constant dizziness rated 6/10.  He notes blurry vision, ringing in the ears.  "It's a whole bunch of crap and I want to figure it out."  He also notes nausea.  He has a low grade headache that is constant  "it's like being sick all the time".     Pertinent History Patient notes continued swelling in his ankles.     Patient Stated Goals "I want to be able to function again".    Currently in Pain? No/denies                         [facility] Adult PT Treatment/Exercise - 06/08/16 1248      Self-Care   Self-Care Other Self-Care Comments   Other Self-Care Comments  PT and patient discussed medical issues hindering further progress with physical therapy.  PT recommended that we hold further  PT as the patient is having medical issues that are hindering increasing mobility (venous stasis ulceration developed on R leg, insomnia, CHF exacerbtion).  Patient to call after being seen by cardiologist and having leg ulceration addressed.  Also recommended he discuss sleep issues and anxiety with primary care doctor.                 PT Education - 06/08/16 1427    Education provided Yes   Education Details rationale for holding further therapy   Person(s) Educated Patient   Methods Explanation   Comprehension Verbalized understanding          PT Short Term Goals - 05/02/16 1208      PT SHORT TERM GOAL #1   Title Pt will be IND in HEP to improve dizziness and balance. TARGET DATE FOR ALL STGS: 04/22/16   Baseline Met on 05/02/16 with habituation and gaze HEP.    Status Achieved     PT SHORT TERM GOAL #2   Title Pt will verbalize dizziness is 5/10 at worst during head turns, in order to improve safety during functional mobility.    Baseline Baseline dizziness is 5-6/10, however dizziness increased to 7/10 with head motion.    Status Partially Met  PT SHORT TERM GOAL #3   Title Perform FGA as indicated and write goal.    Baseline Modify target date to LTGs*   Status Revised     PT SHORT TERM GOAL #4   Title Perform positional testing and write goals as indicated.   Baseline Patient continues with 8/10 symptoms with sidelying.   Status Achieved           PT Long Term Goals - 05/26/16 1011      PT LONG TERM GOAL #1   Title Pt will improve gait speed to >/=2.58f/sec to safely amb. in the community.   Baseline MODIFIED TARGET DATE IS 06/26/16   Status Revised     PT LONG TERM GOAL #2   Title Pt will amb. 500', IND, over even/uneven terrain, with </=1/10 dizziness while performing head turns in order to improve functional mobility.    Baseline MODIFIED TARGET DATE IS 06/26/16   Status Revised     PT LONG TERM GOAL #3   Title Pt will will improve DHI score to  >/=72% to improve quality of life.    Baseline MODIFIED TARGET DATE IS 06/26/16   Status Revised     PT LONG TERM GOAL #4   Title IMprove FGA from 9/30 to > or equal to 14/30 to demo improving dynamic activity tolerance.   Baseline Target date 06/26/16   Time 4   Period Weeks               Plan - 06/08/16 1428    Clinical Impression Statement The patient is not able to progress physical therapy program at home due to medical complications of LE ulceration with venous stasis and significant edema, insomnia, reported frustration/anxiety, and overall fluid retention.  He is attempting to be scheduled with cardiologist.  The patient is not making progress that warrants continuation of PT at this time as medical conditions hindering participation in program.  Patient to be on hold and can return once other medical issues addressed, if treatment warranted.    PT Treatment/Interventions ADLs/Self Care Home Management;Biofeedback;Canalith Repostioning;Neuromuscular re-education;Balance training;Therapeutic exercise;Therapeutic activities;Functional mobility training;Stair training;Gait training;DME Instruction;Patient/family education;Vestibular;Manual techniques   PT Next Visit Plan On Hold until further medical evaluation/treatment.   Consulted and Agree with Plan of Care Patient      Patient will benefit from skilled therapeutic intervention in order to improve the following deficits and impairments:  Abnormal gait, Decreased endurance, Obesity, Pain, Impaired flexibility, Dizziness, Decreased balance, Decreased mobility, Decreased knowledge of use of DME  Visit Diagnosis: Dizziness and giddiness     Problem List There are no active problems to display for this patient.   [place] PT 06/08/2016, 2:30 PM  [place] [address]Phone: [phone]  Fax:  [phone] Name: AEdenilson AustadMRN: [id number] of Birth: 202-21-1962

## 2016-06-13 ENCOUNTER — Emergency Department (HOSPITAL_COMMUNITY): Payer: Commercial Managed Care - PPO

## 2016-06-13 ENCOUNTER — Ambulatory Visit (HOSPITAL_BASED_OUTPATIENT_CLINIC_OR_DEPARTMENT_OTHER)
Admission: RE | Admit: 2016-06-13 | Discharge: 2016-06-13 | Disposition: A | Discharge status: Home / Self Care | Payer: Commercial Managed Care - PPO | Source: Ambulatory Visit | Attending: Emergency Medicine | Admitting: Emergency Medicine

## 2016-06-13 ENCOUNTER — Emergency Department (HOSPITAL_COMMUNITY)
Admission: EM | Admit: 2016-06-13 | Discharge: 2016-06-13 | Disposition: A | Discharge status: Home / Self Care | Payer: Commercial Managed Care - PPO | Attending: Emergency Medicine | Admitting: Emergency Medicine

## 2016-06-13 ENCOUNTER — Encounter (HOSPITAL_COMMUNITY): Payer: Self-pay | Admitting: Emergency Medicine

## 2016-06-13 DIAGNOSIS — L03115 Cellulitis of right lower limb: Secondary | ICD-10-CM | POA: Diagnosis not present

## 2016-06-13 DIAGNOSIS — M7989 Other specified soft tissue disorders: Secondary | ICD-10-CM

## 2016-06-13 DIAGNOSIS — R2241 Localized swelling, mass and lump, right lower limb: Secondary | ICD-10-CM | POA: Diagnosis present

## 2016-06-13 HISTORY — DX: Venous insufficiency (chronic) (peripheral): I87.2

## 2016-06-13 LAB — CBC
HCT: 42.8 % (ref 39.0–52.0)
HEMOGLOBIN: 14 g/dL (ref 13.0–17.0)
MCH: 30.9 pg (ref 26.0–34.0)
MCHC: 32.7 g/dL (ref 30.0–36.0)
MCV: 94.5 fL (ref 78.0–100.0)
Platelets: 261 10*3/uL (ref 150–400)
RBC: 4.53 MIL/uL (ref 4.22–5.81)
RDW: 14.3 % (ref 11.5–15.5)
WBC: 7.1 10*3/uL (ref 4.0–10.5)

## 2016-06-13 LAB — BASIC METABOLIC PANEL
ANION GAP: 10 (ref 5–15)
BUN: 18 mg/dL (ref 6–20)
CALCIUM: 9 mg/dL (ref 8.9–10.3)
CO2: 29 mmol/L (ref 22–32)
Chloride: 101 mmol/L (ref 101–111)
Creatinine, Ser: 0.99 mg/dL (ref 0.61–1.24)
GLUCOSE: 130 mg/dL — AB (ref 65–99)
Potassium: 4.3 mmol/L (ref 3.5–5.1)
Sodium: 140 mmol/L (ref 135–145)

## 2016-06-13 LAB — I-STAT TROPONIN, ED: Troponin i, poc: 0 ng/mL (ref 0.00–0.08)

## 2016-06-13 MED ORDER — CLINDAMYCIN PHOSPHATE 900 MG/50ML IV SOLN
900.0000 mg | Freq: Once | INTRAVENOUS | Status: AC
  Administered 2016-06-13: 900 mg via INTRAVENOUS
  Filled 2016-06-13: qty 50

## 2016-06-13 MED ORDER — CLINDAMYCIN HCL 300 MG PO CAPS: 300.0000 mg | ORAL_CAPSULE | Freq: Four times a day (QID) | ORAL | 0 refills | Status: AC

## 2016-06-13 NOTE — ED Provider Notes (Signed)
[facility]-EMERGENCY DEPT Provider Note   CSN: [id number] Arrival date & time: 06/13/16  4098     History   Chief Complaint Chief Complaint  Patient presents with  . Leg Swelling    HPI Antonio Salazar is a 56 y.o. male hx of Chronic leg swelling, vestibular neuritis, here presenting with worsening leg swelling and redness. Patient states that he has leg swelling that has been getting worse over the last several weeks. Patient is currently taking 80 mg of Lasix. He has been referred to a cardiologist but has not seen them yet and recently the leg swelling got worse so he was told to double the Lasix. However, patient states that this causes vestibular neuritis to get worse so he felt more dizzy than usual. For the last week or so, he noticed some more redness in his right calf area. He also has some yellow drainage from it. Denies injury or injury of blood clots or previous skin infection there. He states that he has some cough but denies any chest pain or shortness of breath.     Past Medical History:  Diagnosis Date  . Venous insufficiency     There are no active problems to display for this patient.   History reviewed. No pertinent surgical history.     Home Medications    Prior to Admission medications   Medication Sig Start Date End Date Taking? Authorizing Provider  diazepam (VALIUM) 5 MG tablet Take 5 mg by mouth every 6 (six) hours as needed for anxiety.   Yes Historical Provider, MD  furosemide (LASIX) 80 MG tablet Take 80 mg by mouth 2 (two) times daily. 06/08/16  Yes Historical Provider, MD  hydrochlorothiazide (HYDRODIURIL) 25 MG tablet Take 25 mg by mouth daily. 01/22/16  Yes Historical Provider, MD  meclizine (ANTIVERT) 25 MG tablet Take 1 tablet (25 mg total) by mouth 3 (three) times daily as needed for dizziness. 02/10/16  Yes Nicole Pisciotta, PA-C    Family History No family history on file.  Social History Social History  Substance Use Topics  . Smoking  status: Never Smoker  . Smokeless tobacco: Never Used  . Alcohol use No     Allergies   Patient has no known allergies.   Review of Systems Review of Systems  Musculoskeletal:       R calf pain, swelling and redness   All other systems reviewed and are negative.    Physical Exam Updated Vital Signs BP 134/77 (BP Location: Right Arm)   Pulse 99   Temp 98.3 F (36.8 C) (Oral)   Resp 19   Ht 6' (1.829 m)   Wt (!) 453 lb (205.5 kg)   SpO2 94%   BMI 61.44 kg/m   Physical Exam  Constitutional: He is oriented to person, place, and time. He appears well-developed.  Obese   HENT:  Head: Normocephalic.  Eyes: Pupils are equal, round, and reactive to light.  Neck: Normal range of motion.  Cardiovascular: Normal rate, regular rhythm and normal heart sounds.   Pulmonary/Chest: Effort normal and breath sounds normal.  Diminished bilateral bases   Abdominal: Soft. Bowel sounds are normal. He exhibits no distension. There is no tenderness.  Musculoskeletal: Normal range of motion.  2+ edema R side. + small stage 1 ulcer back of calf with surrounding erythema. No obvious calf tenderness. No skin breakdown on the foot. Good pulses, able to wiggle toes   Neurological: He is alert and oriented to person, place, and time.  Skin: Skin is warm. There is erythema.  Psychiatric: He has a normal mood and affect.  Nursing note and vitals reviewed.    ED Treatments / Results  Labs (all labs ordered are listed, but only abnormal results are displayed) Labs Reviewed  BASIC METABOLIC PANEL - Abnormal; Notable for the following:       Result Value   Glucose, Bld 130 (*)    All other components within normal limits  CBC  I-STAT TROPOININ, ED    EKG  EKG Interpretation  Date/Time:  Monday June 13 2016 05:54:00 EDT Ventricular Rate:  95 PR Interval:    QRS Duration: 97 QT Interval:  351 QTC Calculation: 442 R Axis:   9 Text Interpretation:  Sinus rhythm Low voltage, precordial  leads Abnormal R-wave progression, late transition No significant change since last tracing Confirmed by Zhanna Melin  MD, Caterin Tabares ([id number]) on 06/13/2016 6:09:59 AM       Radiology Dg Chest 2 View  Result Date: 06/13/2016 CLINICAL DATA:  Shortness of breath tonight. EXAM: CHEST  2 VIEW COMPARISON:  None. FINDINGS: Mild hyperinflation. Central bronchitic changes. The cardiomediastinal contours are normal. Mild vascular congestion. No consolidation, pleural effusion, or pneumothorax. No acute osseous abnormalities are seen. IMPRESSION: Mild hyperinflation and central bronchitic change, can be seen with bronchitis or asthma. Mild vascular congestion. Electronically Signed   By: Rubye OaksMelanie  Ehinger M.D.   On: 06/13/2016 05:44    Procedures Procedures (including critical care time)  Medications Ordered in ED Medications  clindamycin (CLEOCIN) IVPB 900 mg (900 mg Intravenous New Bag/Given 06/13/16 0557)     Initial Impression / Assessment and Plan / ED Course  I have reviewed the triage vital signs and the nursing notes.  Pertinent labs & imaging results that were available during my care of the patient were reviewed by me and considered in my medical decision making (see chart for details).    Antonio Salazar is a 56 y.o. male here with R leg swelling and pain and redness. Initially tachycardic but appears in pain. Denies shortness of breath. I think likely mild cellulitis vs venous insufficiency. Less likely DVT and I doubt PE. No vascular US at night. Will get labs. Will give clinda  6:27 AM WBC nl. Given clinda. Chemistry unremarkable. Ordered outpatient vascular US to r/o DVT. Will dc home with clinda. Has cardiology follow up outpatient to dose lasix.    Final Clinical Impressions(s) / ED Diagnoses   Final diagnoses:  None    New Prescriptions New Prescriptions   No medications on file     Charlynne Panderavid Hsienta Hebah Bogosian, MD 06/13/16 [phone]

## 2016-06-13 NOTE — Discharge Instructions (Signed)
Take clindamycin four times daily for a week.   Continue taking lasix for now. See your cardiologist regarding this.   Go to vascular US today around 8:30 am to get DVT study. If it shows a blood clot, you will be sent to the ED to get a prescription for a blood thinner.   Return to ER if you have worse leg swelling, cellulitis, purulent drainage, chest pain, trouble breathing, shortness of breath.

## 2016-06-13 NOTE — ED Notes (Signed)
Patient transported to X-ray 

## 2016-06-13 NOTE — Progress Notes (Signed)
*  PRELIMINARY RESULTS* Vascular Ultrasound Right lower extremity venous duplex has been completed.  Preliminary findings: Technically limited due to body habitus. Not all veins well visualized. No obvious DVT noted in the RLE.    Farrel DemarkJill Eunice, RDMS, RVT  06/13/2016, 8:57 AM

## 2016-06-13 NOTE — ED Triage Notes (Signed)
Pt c/o right leg swelling with yellow fluid drainage. Pt presents with redness and swelling to right leg, with, cracked, dry peeling skin.

## 2016-06-15 ENCOUNTER — Encounter: Payer: Managed Care, Other (non HMO) | Admitting: Rehabilitative and Restorative Service Providers"

## 2016-06-22 ENCOUNTER — Encounter: Payer: Managed Care, Other (non HMO) | Admitting: Rehabilitative and Restorative Service Providers"

## 2016-06-23 ENCOUNTER — Telehealth: Payer: Self-pay | Admitting: Rehabilitative and Restorative Service Providers"

## 2016-06-23 NOTE — Telephone Encounter (Signed)
Called patient to ensure he has been seen for LE edema. He was seen by cardiologist and they dx him with cellulitis.  He notes the swelling cleared.   He reports he went to ENT office today due to needing to check on benefits.  He notes he has been walking more with a single point cane (ordered online).  He notes continued dizziness when moving/standing.  He is continuing to take meclizine and got a refill- taking when needing it (upon first waking)- which is every day. He is doing exercises regularly and notes "it's getting better".  Symptoms got worse after getting a cold, but are already improving.  Symptoms clear quicker and don't linger as long.  He is going to have a hearing test.  Margretta Ditty, PT

## 2016-06-30 ENCOUNTER — Ambulatory Visit
Payer: Commercial Managed Care - PPO | Attending: Physician Assistant | Admitting: Rehabilitative and Restorative Service Providers"

## 2016-06-30 DIAGNOSIS — R2689 Other abnormalities of gait and mobility: Secondary | ICD-10-CM | POA: Diagnosis present

## 2016-06-30 DIAGNOSIS — R42 Dizziness and giddiness: Secondary | ICD-10-CM

## 2016-06-30 NOTE — Therapy (Signed)
[place] [address] [place], [place], [address] Phone: [phone]   Fax:  [phone]  Physical Therapy Treatment  Patient Details  Name: Antonio Salazar MRN: [id number] Date of Birth: [birth year] Referring Provider: Dr. Janace Hoard, Dr. Ezequiel Essex  Encounter Date: 06/30/2016      PT End of Session - 06/30/16 1353    Visit Number 9   Number of Visits 17   Date for PT Re-Evaluation 06/24/16   Authorization Type [employer]-60 visit limit   PT Start Time 1320   PT Stop Time 1400   PT Time Calculation (min) 40 min   Activity Tolerance Treatment limited secondary to medical complications (Comment)   Behavior During Therapy [facility] for tasks assessed/performed      Past Medical History:  Diagnosis Date  . Venous insufficiency     No past surgical history on file.  There were no vitals filed for this visit.      Subjective Assessment - 06/30/16 1322    Subjective The patient reports dizziness is continuing stating "it's there all the time".  He is continuing with tinnitus, dry mouth.  "I'm trying to get off the dizzy pills."  His last "attack" was right before Easter.  He noted a recent bad cold that "kicked off" symptoms.  Baseline symptoms are described as "constantly feels like I will fall down".  Attack is described as R ear pain "overwhelming", "I can't move because I should be on the floor b/c it is the safest place", "light is so intense I have to cover my eyes", "intesne spinning", "dry mouth".  He further describes it as motion sickness and states "it feels like I'm going to stroke out."   The baseline symptoms are "ears ringing", sensation of swaying motion,  like "I'm going to fall out of the chair".  He denies true spinning sensation as baseline symptom.   The patient notes that legs had cellulitis and it is improved with medications and bacterial soap.    The patient is moving better and feels he is improving with  HEP.   Patient Stated Goals "I want to be able to function again".    Currently in Pain? No/denies            [facility] PT Assessment - 06/30/16 1333      Ambulation/Gait   Ambulation/Gait Yes   Ambulation/Gait Assistance 7: Independent   Ambulation/Gait Assistance Details patient is carrying SPC at times.  PT did not test with it as it is not necessary for level surface negotiation.    Gait velocity 2.73 ft/sec     Functional Gait  Assessment   Gait assessed  Yes   Gait Level Surface Walks 20 ft, slow speed, abnormal gait pattern, evidence for imbalance or deviates 10-15 in outside of the 12 in walkway width. Requires more than 7 sec to ambulate 20 ft.  7.47 seconds   Change in Gait Speed Makes only minor adjustments to walking speed, or accomplishes a change in speed with significant gait deviations, deviates 10-15 in outside the 12 in walkway width, or changes speed but loses balance but is able to recover and continue walking.   Gait with Horizontal Head Turns Performs head turns with moderate changes in gait velocity, slows down, deviates 10-15 in outside 12 in walkway width but recovers, can continue to walk.   Gait with Vertical Head Turns Performs task with moderate change in gait velocity, slows down, deviates 10-15 in outside 12 in walkway  width but recovers, can continue to walk.  increases symptoms from 4-5/10 up to 6/10   Gait and Pivot Turn Pivot turns safely in greater than 3 sec and stops with no loss of balance, or pivot turns safely within 3 sec and stops with mild imbalance, requires small steps to catch balance.   Step Over Obstacle Is able to step over one shoe box (4.5 in total height) but must slow down and adjust steps to clear box safely. May require verbal cueing.   Gait with Narrow Base of Support Ambulates 4-7 steps.  challenging due to body habitus   Gait with Eyes Closed Cannot walk 20 ft without assistance, severe gait deviations or imbalance, deviates greater  than 15 in outside 12 in walkway width or will not attempt task.   Ambulating Backwards Walks 20 ft, uses assistive device, slower speed, mild gait deviations, deviates 6-10 in outside 12 in walkway width.   Steps Two feet to a stair, must use rail.   Total Score 11   FGA comment: 11/30                     [facility] Adult PT Treatment/Exercise - 06/30/16 1354      Ambulation/Gait   Ambulation/Gait Yes   Ambulation/Gait Assistance 7: Independent   Ambulation/Gait Assistance Details patient is carrying SPC at times.  PT tested without device as it is not considered necessary for safe mobility.  It provides patient with confidence per report.   Gait velocity 2.73 ft/sec   Gait Comments Attempted 500 ft of outdoor ambulation per LTG, however the patient has shortness of breath and fatigued after 300 ft.  PT recommended he increase his walking as we discussed ambulation distance as biggest barrier for return to work.     Neuro Re-ed    Neuro Re-ed Details  The patient performed standing head motion horizontally using visual fixation + targets R<>L.  Then performed 180 degree turns spotting objects for visual fixation.          Vestibular Treatment/Exercise - 06/30/16 2204      Vestibular Treatment/Exercise   Vestibular Treatment Provided Gaze   Gaze Exercises X1 Viewing Horizontal     X1 Viewing Horizontal   Foot Position standing feet apart   Comments 20 seconds with visible refixation saccades noted; PT re-instructed in technique and had patient perfomr x 2 reps.                PT Education - 06/30/16 1558    Education provided Yes   Education Details HEP: gaze x 1, 180 degree turns, walking for exercise (*emphasized walking!!)   Person(s) Educated Patient   Methods Explanation;Demonstration;Handout   Comprehension Verbalized understanding;Returned demonstration          PT Short Term Goals - 05/02/16 1208      PT SHORT TERM GOAL #1   Title Pt will be IND  in HEP to improve dizziness and balance. TARGET DATE FOR ALL STGS: 04/22/16   Baseline Met on 05/02/16 with habituation and gaze HEP.    Status Achieved     PT SHORT TERM GOAL #2   Title Pt will verbalize dizziness is 5/10 at worst during head turns, in order to improve safety during functional mobility.    Baseline Baseline dizziness is 5-6/10, however dizziness increased to 7/10 with head motion.    Status Partially Met     PT SHORT TERM GOAL #3   Title Perform FGA as indicated  and write goal.    Baseline Modify target date to LTGs*   Status Revised     PT SHORT TERM GOAL #4   Title Perform positional testing and write goals as indicated.   Baseline Patient continues with 8/10 symptoms with sidelying.   Status Achieved           PT Long Term Goals - 06/30/16 1332      PT LONG TERM GOAL #1   Title Pt will improve gait speed to >/=2.43f/sec to safely amb. in the community.   Baseline Improved to 2.73 ft/sec at today's session 06/29/16   Status Achieved     PT LONG TERM GOAL #2   Title Pt will amb. 500', IND, over even/uneven terrain, with </=1/10 dizziness while performing head turns in order to improve functional mobility.    Baseline Patient walks 200 ft with shortness of breath due to obesity.  Dizziness was 4/10 at baseline, so not met--increased to 6/10 at rest.    Status Not Met     PT LONG TERM GOAL #3   Title Pt will will improve DHI score to >/=72% to improve quality of life.    Baseline Did not retest at today's visit.   Status On-going     PT LONG TERM GOAL #4   Title IMprove FGA from 9/30 to > or equal to 14/30 to demo improving dynamic activity tolerance.   Baseline Improved to 11/30.     Time 4   Period Weeks   Status Partially Met      UPDATED LONG TERM GOALS TODAY:        PT Long Term Goals - 06/30/16 2212      PT LONG TERM GOAL #1   Title The patient will be indep with progression of HEP to include further habituation for motion sensitivity,  walking, and progression of gaze adaptation.   Baseline Target date 07/29/2016   Time 4   Period Weeks   Status New     PT LONG TERM GOAL #2   Title The patient will ambulate >500 ft without increase in dizziness > or equal than 2/10 from baseline level in order to work towards return to work.   Baseline Target date 07/29/16   Time 4   Period Weeks   Status Revised     PT LONG TERM GOAL #3   Title Patient will improve DHI score by 20% to demo reduction of self perceived dizziness for daily activities.   Baseline target date 07/29/16   Time 4   Period Weeks   Status New     PT LONG TERM GOAL #4   Title The patient will tolerate gaze x 1 viewing x 60 seconds nonstop to demo improved VOR.   Baseline Target date 07/29/16   Time 4   Period Weeks   Status New             Plan - 06/30/16 2201    Clinical Impression Statement The patient returns to therapy noting continued dizziness, but able to participate with cellulitis treated in LEs.  The patient partially met LTGs.  Patient notes that walking is his biggest limiting factor for return to work at this time, as well as baseline level of "dizziness".  PT recommended he work hard to progress walking t/o the day as he is short of breath for limited distances today (300 ft). Modified HEP and updated LTGs.    PT Frequency 1x / week   PT Duration  4 weeks   PT Treatment/Interventions ADLs/Self Care Home Management;Biofeedback;Canalith Repostioning;Neuromuscular re-education;Balance training;Therapeutic exercise;Therapeutic activities;Functional mobility training;Stair training;Gait training;DME Instruction;Patient/family education;Vestibular;Manual techniques   PT Next Visit Plan Renewed x 4 more weeks.  Progress walking, dynamic gait, head motions with balance activities, and gaze adaptation.   Consulted and Agree with Plan of Care Patient      Patient will benefit from skilled therapeutic intervention in order to improve the following  deficits and impairments:  Abnormal gait, Decreased endurance, Obesity, Pain, Impaired flexibility, Dizziness, Decreased balance, Decreased mobility, Decreased knowledge of use of DME  Visit Diagnosis: Dizziness and giddiness  Other abnormalities of gait and mobility     Problem List There are no active problems to display for this patient.   [place], [place] 06/30/2016, 10:11 PM  [place] [address] [place], [place], [address] Phone: [phone]   Fax:  [phone]  Name: Ahren Pettinger MRN: [id number] Date of Birth: Aug 07, 1960

## 2016-06-30 NOTE — Patient Instructions (Signed)
Gaze Stabilization - Tip Card  1.Target must remain in focus, not blurry, and appear stationary while head is in motion. 2.Perform exercises with small head movements (45 to either side of midline). 3.Increase speed of head motion so long as target is in focus. 4.If you wear eyeglasses, be sure you can see target through lens (therapist will give specific instructions for bifocal / progressive lenses). 5.These exercises may provoke dizziness or nausea. Work through these symptoms. If too dizzy, slow head movement slightly. Rest between each exercise. 6.Exercises demand concentration; avoid distractions. 7.For safety, perform standing exercises close to a counter, wall, corner, or next to someone.  Copyright  VHI. All rights reserved.   Gaze Stabilization - Standing Feet Apart   Feet shoulder width apart, keeping eyes on target on wall 3 feet away, tilt head down slightly and move head side to side for 30 seconds. Repeat while moving head up and down for 30 seconds. *Work up to tolerating 60 seconds, as able. Do 2-3 sessions per day.   Copyright  VHI. All rights reserved.    Turning in Place: Solid Surface    Standing in place, lead with head and turn slowly making 1/2 turns to the right.  Let dizziness settle.  Do 1/2 turn to the left.  Repeat __5__ times per session. Do _2___ sessions per day.   Copyright  VHI. All rights reserved.   WALKING:  Walk on level, outdoor surfaces (to end of driveway and back) x 4 times per day.    Increase to 6 times per day by next week.

## 2016-07-11 ENCOUNTER — Ambulatory Visit: Payer: Commercial Managed Care - PPO | Admitting: Rehabilitative and Restorative Service Providers"

## 2016-07-18 ENCOUNTER — Ambulatory Visit: Payer: Commercial Managed Care - PPO | Admitting: Rehabilitative and Restorative Service Providers"

## 2016-07-18 DIAGNOSIS — R42 Dizziness and giddiness: Secondary | ICD-10-CM

## 2016-07-18 DIAGNOSIS — R2689 Other abnormalities of gait and mobility: Secondary | ICD-10-CM

## 2016-07-18 NOTE — Therapy (Signed)
[facility] [address] [place], [place], [address] Phone: [phone]   Fax:  [phone]  Physical Therapy Treatment  Patient Details  Name: Antonio Salazar MRN: [id number] Date of Birth: Jul 27, 1960 Referring Provider: Dr. Janace Hoard, Dr. Ezequiel Essex  Encounter Date: 07/18/2016      PT End of Session - 07/18/16 1254    Visit Number 10   Number of Visits 17   Date for PT Re-Evaluation 06/24/16   Authorization Type [employer]-60 visit limit   PT Start Time 1220   PT Stop Time 1250   PT Time Calculation (min) 30 min   Activity Tolerance Treatment limited secondary to medical complications (Comment)   Behavior During Therapy [facility] for tasks assessed/performed      Past Medical History:  Diagnosis Date  . Venous insufficiency     No past surgical history on file.  There were no vitals filed for this visit.      Subjective Assessment - 07/18/16 1222    Subjective The patient reports he has lost 64 lbs with fluid pills over the past few weeks.  He notes dizziness is 5/10.  He feels that he is walking more and this is helping his daily mobility.    He has an appointment with Dr. Marta Antu (audiologist) at [facility].  The patient reports he is feeling better now than he has felt in almost a year, however he still reports 5/10 symptoms.  PT inquired further--he describes a sensation that he can't focus, "nothing is clear", feels like he has been up for days.   He describes a "lack of confidence" with mobility.   He also c/o dry mouth, ringing in his ears.   He also notes light sensitivity.    He stopped taking the meclizine.   Patient Stated Goals "I want to be able to function again".    Currently in Pain? No/denies                         [facility] Adult PT Treatment/Exercise - 07/18/16 1236      Ambulation/Gait   Ambulation/Gait Yes   Ambulation/Gait Assistance 7: Independent   Ambulation/Gait  Assistance Details patient carries a cane--PT discusses he does not need the cane as he is not using it correctly.     Gait Comments Walked for 5.5 minutes today with 2 standing rest breaks due to shortness of breath.       Self-Care   Self-Care Other Self-Care Comments   Other Self-Care Comments  The patient reports some of the turns for HEP aggravate symptoms describing light sensitivity.  He notes he stands now for doing dishes.    The patient is walking 1x/day.  PT encouraged him to walk more often during the day.    PT discussed that at this time, the patient's symptoms are not responding to vestibular rehab as he has reported 5/10 symtpoms consistently.  He reports he is doing better, but still rates 5/10 symptoms.  We discussed progressing activities in his home and we will f/u once report from audiologist available.      Neuro Re-ed    Neuro Re-ed Details  Gaze x 1 reviewed--patient encouraged to increase speed                  PT Short Term Goals - 05/02/16 1208      PT SHORT TERM GOAL #1   Title Pt will be IND in HEP  to improve dizziness and balance. TARGET DATE FOR ALL STGS: 04/22/16   Baseline Met on 05/02/16 with habituation and gaze HEP.    Status Achieved     PT SHORT TERM GOAL #2   Title Pt will verbalize dizziness is 5/10 at worst during head turns, in order to improve safety during functional mobility.    Baseline Baseline dizziness is 5-6/10, however dizziness increased to 7/10 with head motion.    Status Partially Met     PT SHORT TERM GOAL #3   Title Perform FGA as indicated and write goal.    Baseline Modify target date to LTGs*   Status Revised     PT SHORT TERM GOAL #4   Title Perform positional testing and write goals as indicated.   Baseline Patient continues with 8/10 symptoms with sidelying.   Status Achieved           PT Long Term Goals - 07/18/16 1257      PT LONG TERM GOAL #1   Title The patient will be indep with progression of HEP to  include further habituation for motion sensitivity, walking, and progression of gaze adaptation.   Baseline Met on 07/18/2016--further progression not performed due to shortness of breath/endurance limitations.  PT encourages more walking!   Time 4   Period Weeks   Status Achieved     PT LONG TERM GOAL #2   Title The patient will ambulate >500 ft without increase in dizziness > or equal than 2/10 from baseline level in order to work towards return to work.   Baseline Target date 07/29/16   Time 4   Period Weeks   Status On-going     PT LONG TERM GOAL #3   Title Patient will improve DHI score by 20% to demo reduction of self perceived dizziness for daily activities.   Baseline target date 07/29/16   Time 4   Period Weeks   Status On-going     PT LONG TERM GOAL #4   Title The patient will tolerate gaze x 1 viewing x 60 seconds nonstop to demo improved VOR.   Baseline Patient tolerates 60 seconds of head motion with VOR, noting mild sensation of nausea.     Time 4   Period Weeks   Status Achieved               Plan - 07/18/16 1412    Clinical Impression Statement PT and patient again discussed that some of his clinical presentation is not in our scope of treatment-- including tinnitus and foggy feeling in his head, also notes sensitivity to light.  PT recommended we hold further sessions until after receiving information from audiology testing as patient is not responding quickly to HEP for VOR and habituation.     PT Treatment/Interventions ADLs/Self Care Home Management;Biofeedback;Canalith Repostioning;Neuromuscular re-education;Balance training;Therapeutic exercise;Therapeutic activities;Functional mobility training;Stair training;Gait training;DME Instruction;Patient/family education;Vestibular;Manual techniques   PT Next Visit Plan On Hold--recommended patient continue walking, gaze x 1 for VOR.  Patient to have audiology notes sent to PT in order to determine if further  activities indicated.  See self care.   Consulted and Agree with Plan of Care Patient      Patient will benefit from skilled therapeutic intervention in order to improve the following deficits and impairments:  Abnormal gait, Decreased endurance, Obesity, Pain, Impaired flexibility, Dizziness, Decreased balance, Decreased mobility, Decreased knowledge of use of DME  Visit Diagnosis: Dizziness and giddiness  Other abnormalities of gait and mobility  Problem List There are no active problems to display for this patient.   [place], PT 07/18/2016, 2:30 PM  [facility] [address] [place], [place], [address] Phone: [phone]   Fax:  [phone]  Name: Antonio Salazar MRN: [id number] Date of Birth: [birth year]

## 2016-07-25 ENCOUNTER — Encounter: Payer: Managed Care, Other (non HMO) | Admitting: Rehabilitative and Restorative Service Providers"

## 2016-08-01 ENCOUNTER — Encounter: Payer: Managed Care, Other (non HMO) | Admitting: Rehabilitative and Restorative Service Providers"

## 2016-10-07 ENCOUNTER — Ambulatory Visit: Payer: Commercial Managed Care - PPO | Admitting: Rehabilitative and Restorative Service Providers"

## 2016-10-14 ENCOUNTER — Ambulatory Visit: Payer: Commercial Managed Care - PPO | Admitting: Family Medicine

## 2016-10-19 ENCOUNTER — Encounter: Payer: Self-pay | Admitting: Rehabilitative and Restorative Service Providers"

## 2016-10-19 NOTE — Therapy (Signed)
[facility] [address] [place], [place], [address] Phone: [phone]   Fax:  [phone]  Patient Details  Name: Antonio Salazar MRN: [id number] Date of Birth: 02-15-1961 Referring Provider:  Dr. Clent Ridges  Encounter Date: last encounter 07/18/16  PHYSICAL THERAPY DISCHARGE SUMMARY  Visits from Start of Care: 10  Current functional level related to goals / functional outcomes:     PT Short Term Goals - 05/02/16 1208      PT SHORT TERM GOAL #1   Title Pt will be IND in HEP to improve dizziness and balance. TARGET DATE FOR ALL STGS: 04/22/16   Baseline Met on 05/02/16 with habituation and gaze HEP.    Status Achieved     PT SHORT TERM GOAL #2   Title Pt will verbalize dizziness is 5/10 at worst during head turns, in order to improve safety during functional mobility.    Baseline Baseline dizziness is 5-6/10, however dizziness increased to 7/10 with head motion.    Status Partially Met     PT SHORT TERM GOAL #3   Title Perform FGA as indicated and write goal.    Baseline Modify target date to LTGs*   Status Revised     PT SHORT TERM GOAL #4   Title Perform positional testing and write goals as indicated.   Baseline Patient continues with 8/10 symptoms with sidelying.   Status Achieved         PT Long Term Goals - 07/18/16 1257      PT LONG TERM GOAL #1   Title The patient will be indep with progression of HEP to include further habituation for motion sensitivity, walking, and progression of gaze adaptation.   Baseline Met on 07/18/2016--further progression not performed due to shortness of breath/endurance limitations.  PT encourages more walking!   Time 4   Period Weeks   Status Achieved     PT LONG TERM GOAL #2   Title The patient will ambulate >500 ft without increase in dizziness > or equal than 2/10 from baseline level in order to work towards return to work.   Baseline Target date 07/29/16   Time 4   Period Weeks   Status On-going     PT LONG TERM GOAL #3   Title Patient will improve DHI score by 20% to demo reduction of self perceived dizziness for daily activities.   Baseline target date 07/29/16   Time 4   Period Weeks   Status On-going     PT LONG TERM GOAL #4   Title The patient will tolerate gaze x 1 viewing x 60 seconds nonstop to demo improved VOR.   Baseline Patient tolerates 60 seconds of head motion with VOR, noting mild sensation of nausea.     Time 4   Period Weeks   Status Achieved        Remaining deficits: Continued constant dizziness, headache.   Education / Equipment: Home program, home walking.  Plan: Patient agrees to discharge.  Patient goals were partially met. Patient is being discharged due to a change in medical status.  ?????         Thank you for the referral of this patient. Rudell Cobb, MPT   Ryliee Figge 10/19/2016, 9:10 AM  [facility] [address] [place], [place], [address] Phone: [phone]   Fax:  [phone]

## 2017-04-24 ENCOUNTER — Encounter (HOSPITAL_COMMUNITY): Payer: Self-pay | Admitting: *Deleted

## 2017-04-24 ENCOUNTER — Other Ambulatory Visit: Payer: Self-pay

## 2017-04-24 ENCOUNTER — Emergency Department (HOSPITAL_COMMUNITY)
Admission: EM | Admit: 2017-04-24 | Discharge: 2017-04-25 | Disposition: A | Discharge status: Home / Self Care | Payer: Commercial Managed Care - PPO | Attending: Emergency Medicine | Admitting: Emergency Medicine

## 2017-04-24 DIAGNOSIS — M533 Sacrococcygeal disorders, not elsewhere classified: Secondary | ICD-10-CM | POA: Diagnosis present

## 2017-04-24 DIAGNOSIS — Z79899 Other long term (current) drug therapy: Secondary | ICD-10-CM | POA: Diagnosis not present

## 2017-04-24 DIAGNOSIS — R52 Pain, unspecified: Secondary | ICD-10-CM

## 2017-04-24 DIAGNOSIS — K5900 Constipation, unspecified: Secondary | ICD-10-CM | POA: Insufficient documentation

## 2017-04-24 DIAGNOSIS — K5732 Diverticulitis of large intestine without perforation or abscess without bleeding: Secondary | ICD-10-CM | POA: Insufficient documentation

## 2017-04-24 DIAGNOSIS — K5792 Diverticulitis of intestine, part unspecified, without perforation or abscess without bleeding: Secondary | ICD-10-CM

## 2017-04-24 LAB — COMPREHENSIVE METABOLIC PANEL
ALT: 21 U/L (ref 17–63)
AST: 20 U/L (ref 15–41)
Albumin: 3.9 g/dL (ref 3.5–5.0)
Alkaline Phosphatase: 55 U/L (ref 38–126)
Anion gap: 10 (ref 5–15)
BUN: 13 mg/dL (ref 6–20)
CO2: 29 mmol/L (ref 22–32)
Calcium: 9.1 mg/dL (ref 8.9–10.3)
Chloride: 101 mmol/L (ref 101–111)
Creatinine, Ser: 0.86 mg/dL (ref 0.61–1.24)
GFR calc Af Amer: >60 mL/min (ref >60)
GFR calc non Af Amer: >60 mL/min (ref >60)
Glucose, Bld: 111 mg/dL — ABNORMAL HIGH (ref 65–99)
Potassium: 4.6 mmol/L (ref 3.5–5.1)
Sodium: 140 mmol/L (ref 135–145)
Total Bilirubin: 1.6 mg/dL — ABNORMAL HIGH (ref 0.3–1.2)
Total Protein: 7 g/dL (ref 6.5–8.1)

## 2017-04-24 LAB — CBC
HCT: 44.7 % (ref 39.0–52.0)
Hemoglobin: 14.8 g/dL (ref 13.0–17.0)
MCH: 31.7 pg (ref 26.0–34.0)
MCHC: 33.1 g/dL (ref 30.0–36.0)
MCV: 95.7 fL (ref 78.0–100.0)
Platelets: 233 10*3/uL (ref 150–400)
RBC: 4.67 MIL/uL (ref 4.22–5.81)
RDW: 14.7 % (ref 11.5–15.5)
WBC: 6.3 10*3/uL (ref 4.0–10.5)

## 2017-04-24 LAB — URINALYSIS, ROUTINE W REFLEX MICROSCOPIC
Bilirubin Urine: NEGATIVE
Glucose, UA: NEGATIVE mg/dL
Hgb urine dipstick: NEGATIVE
Ketones, ur: NEGATIVE mg/dL
Leukocytes, UA: NEGATIVE
Nitrite: NEGATIVE
Protein, ur: NEGATIVE mg/dL
Specific Gravity, Urine: 1.02 (ref 1.005–1.030)
pH: 5 (ref 5.0–8.0)

## 2017-04-24 LAB — LIPASE, BLOOD: Lipase: 20 U/L (ref 11–51)

## 2017-04-24 NOTE — ED Provider Notes (Signed)
[facility] EMERGENCY DEPARTMENT Provider Note   CSN: [id number] Arrival date & time: 04/24/17  1445     History   Chief Complaint Chief Complaint  Patient presents with  . Abdominal Pain    HPI Antonio Salazar is a 57 y.o. male w/ h/o morbid obesity here for evaluation of tail "bone" pain, feels deep, like a bruise and goes "under and up" into his rectum x 1 month.  Associated symptoms include constipation, BRBPR, lower abdominal soreness.   Tail bone pain initially worse when he was constipated only however now constant. In the last month he has been intermittently constipated, won't have a BM for several days.  Has needed to use miralax, prune juice, increase fiber to induce a bowel movements. Whenever he stops this regimen bowel movements become harder and he gest constipated causing the tail bone pain to come back. Has been seen by PCP and GI for this. Was told by GI doctor to do a bowel clean out with miralax which he did this weekend, however now he has lower abdominal soreness and tail bone pain is persistent despite having done the clean out and having BM which brought him to ED. BM now watery and clear. Awaiting colonoscopy. Has not had a rectal exam.  Remote BRBPR once or twice one month ago. No actual pain with defecation. No fevers, chills, nausea vomiting, urinary symptoms, changes in urine stream, fall, saddle anesthesia. Has leakage of clear/watery BM while doing bowel clean out.  HPI  Past Medical History:  Diagnosis Date  . Venous insufficiency     There are no active problems to display for this patient.   History reviewed. No pertinent surgical history.     Home Medications    Prior to Admission medications   Medication Sig Start Date End Date Taking? Authorizing Provider  clindamycin (CLEOCIN) 300 MG capsule Take 1 capsule (300 mg total) by mouth 4 (four) times daily. X 7 days 06/13/16   Charlynne Pander, MD  diazepam (VALIUM) 5 MG  tablet Take 5 mg by mouth every 6 (six) hours as needed for anxiety.    [provider]  furosemide (LASIX) 80 MG tablet Take 80 mg by mouth 2 (two) times daily. 06/08/16   [provider]  hydrochlorothiazide (HYDRODIURIL) 25 MG tablet Take 25 mg by mouth daily. 01/22/16   [provider]  meclizine (ANTIVERT) 25 MG tablet Take 1 tablet (25 mg total) by mouth 3 (three) times daily as needed for dizziness. 02/10/16   Pisciotta, Mardella Layman    Family History No family history on file.  Social History Social History   Tobacco Use  . Smoking status: Never Smoker  . Smokeless tobacco: Never Used  Substance Use Topics  . Alcohol use: No  . Drug use: Not on file     Allergies   Patient has no known allergies.   Review of Systems Review of Systems  Gastrointestinal: Positive for abdominal distention and abdominal pain.       +rectal pain  Musculoskeletal: Positive for back pain (tail bone).     Physical Exam Updated Vital Signs BP (!) 169/94 (BP Location: Right Arm)   Pulse 94   Temp 98.7 F (37.1 C) (Oral)   Resp 18   SpO2 98%   Physical Exam  Constitutional: He is oriented to person, place, and time. He appears well-developed and well-nourished. No distress.  Non toxic.  HENT:  Head: Normocephalic and atraumatic.  Nose: Nose normal.  Mouth/Throat: No oropharyngeal exudate.  Moist mucous membranes   Eyes: Conjunctivae and EOM are normal. Pupils are equal, round, and reactive to light.  Neck: Normal range of motion.  Cardiovascular: Normal rate, regular rhythm, normal heart sounds and intact distal pulses.  No murmur heard. 2+ DP and radial pulses bilaterally. No LE edema.   Pulmonary/Chest: Effort normal and breath sounds normal.  Abdominal: Soft. Bowel sounds are normal. There is tenderness in the right lower quadrant and left lower quadrant.  Very difficult exam given body habitus. Soreness to diffuse lower abdomen L and R. No G/R/R.     Genitourinary:  Genitourinary Comments: Attempted rectal exam, difficult given body habitus. Unable to visualize anus or perianal skin. Pt had focal tenderness to sacrum and coccyx.   Musculoskeletal: Normal range of motion. He exhibits no deformity.       Lumbar back: He exhibits tenderness.  Focal tenderness to lumbar spine, sacrum and coccyx. No obvious step offs. No overlaying rash, erythema, warmth, fluctuance. Ambulate, transfers to bed/chair without pain or difficulty.   Neurological: He is alert and oriented to person, place, and time.  Skin: Skin is warm and dry. Capillary refill takes less than 2 seconds.  Psychiatric: He has a normal mood and affect. His behavior is normal. Judgment and thought content normal.  Nursing note and vitals reviewed.    ED Treatments / Results  Labs (all labs ordered are listed, but only abnormal results are displayed) Labs Reviewed  COMPREHENSIVE METABOLIC PANEL - Abnormal; Notable for the following components:      Result Value   Glucose, Bld 111 (*)    Total Bilirubin 1.6 (*)    All other components within normal limits  LIPASE, BLOOD  CBC  URINALYSIS, ROUTINE W REFLEX MICROSCOPIC  POC OCCULT BLOOD, ED    EKG  EKG Interpretation None       Radiology No results found.  Procedures Procedures (including critical care time)  Medications Ordered in ED Medications  iopamidol (ISOVUE-300) 61 % injection (not administered)  ketorolac (TORADOL) 30 MG/ML injection 30 mg (30 mg Intravenous Given 04/25/17 0049)     Initial Impression / Assessment and Plan / ED Course  I have reviewed the triage vital signs and the nursing notes.  Pertinent labs & imaging results that were available during my care of the patient were reviewed by me and considered in my medical decision making (see chart for details).    Will obtain CT AP with lumbar spine. He has no infectious symptoms, abscess lower in differential. No recent trauma or falls, cauda  equina danger signs. External exam limited due to body habitus, he has reproducible midline L, sacral and coccyx tenderness. Given reproducible tenderness w/o obvious infectious process considering hemorrhoid, fissure. Attempted disimpaction and hemocult but i'm not convinced sample was adequate and I could not evaluate anus for fissures hemorrhoids, fluctuance given body habitus. Pt now requesting pain medicine, will avoid opioids given constipation. Toradol for now.   Final Clinical Impressions(s) / ED Diagnoses   0051: Pt will be handed off to oncoming EDPA who will f/u on imaging studies. Anticipate discharge if benign results with bowel regimen and GI f/u which he already has established. There is a concern pt may not fit scanner. He is being sent to [facility] for colonoscopy due to morbid obesity. In this case, if clinical exam is reassuring and labs unremarkable. Pt may be discharged with pain control, bowel regimen, close GI f/u for further imaging studies. Pt discussed  with SP.   0129: re-evaluated patient after CT scans, before hand off. VS WNL. Toradol helped his pain, requesting some at discharge. Consider rx for toradol po, 1g tylenol for pain control if discharged. Final diagnoses:  Pain  Coccyx pain  Constipation, unspecified constipation type    ED Discharge Orders    None         Liberty HandyGibbons, Rotunda Worden J, PA-C 04/25/17 0130    Margarita Grizzleay, Danielle, MD 04/26/17 [phone]

## 2017-04-24 NOTE — ED Triage Notes (Signed)
Pt is here with stomach problems, lack of BM, and rectal discomfort.  Symptoms started one week ago and saw MD and he was prescribed Miralax.  Pt saw Gastroenterologist on Friday.  Pt had the flush with miralax over the weekend.  Pt reports abdominal distention

## 2017-04-25 ENCOUNTER — Telehealth: Payer: Self-pay | Admitting: *Deleted

## 2017-04-25 ENCOUNTER — Emergency Department (HOSPITAL_COMMUNITY): Payer: Commercial Managed Care - PPO

## 2017-04-25 LAB — POC OCCULT BLOOD, ED: Fecal Occult Bld: NEGATIVE

## 2017-04-25 MED ORDER — IOPAMIDOL (ISOVUE-300) INJECTION 61%
INTRAVENOUS | Status: AC
  Administered 2017-04-25: 100 mL via INTRAVENOUS
  Filled 2017-04-25: qty 100

## 2017-04-25 MED ORDER — AMOXICILLIN-POT CLAVULANATE 875-125 MG PO TABS: 1.0000 | ORAL_TABLET | Freq: Two times a day (BID) | ORAL | 0 refills | Status: AC

## 2017-04-25 MED ORDER — KETOROLAC TROMETHAMINE 30 MG/ML IJ SOLN
30.0000 mg | Freq: Once | INTRAMUSCULAR | Status: AC
  Administered 2017-04-25: 30 mg via INTRAVENOUS
  Filled 2017-04-25: qty 1

## 2017-04-25 MED ORDER — LACTINEX PO PACK
PACK | ORAL | 0 refills | Status: AC
Start: 2017-04-25 — End: ?

## 2017-04-25 NOTE — ED Provider Notes (Signed)
5:36 AM Patient care assumed from Sharen Heck, PA-C at change of shift.  Patient awaiting CT scan to further evaluate lower back pain in the setting of constipation.  Laboratory workup reviewed which is generally reassuring.  The patient has been afebrile without tachycardia or hypotension; no SIRS or sepsis criteria.  I have requested to examine the patient's intergluteal cleft to ensure no abscess, though he declines stating this was already completed by a prior provider. CT results show evidence of likely early mild diverticulitis.  Given chronicity of symptoms, this is less likely to explain the cause of patient's lower back pain.  Still, will place on a course of Augmentin for this.  Prostate normal in size on CT scan.  In the absence of pyuria, prostatitis seems unlikely.  Also no evidence of proctitis on imaging.  I have encouraged continued follow-up with GI.    Return precautions discussed and provided. Patient discharged in stable condition with no unaddressed concerns.   Results for orders placed or performed during the hospital encounter of 04/24/17  Lipase, blood  Result Value Ref Range   Lipase 20 11 - 51 U/L  Comprehensive metabolic panel  Result Value Ref Range   Sodium 140 135 - 145 mmol/L   Potassium 4.6 3.5 - 5.1 mmol/L   Chloride 101 101 - 111 mmol/L   CO2 29 22 - 32 mmol/L   Glucose, Bld 111 (H) 65 - 99 mg/dL   BUN 13 6 - 20 mg/dL   Creatinine, Ser 0.98 0.61 - 1.24 mg/dL   Calcium 9.1 8.9 - 11.9 mg/dL   Total Protein 7.0 6.5 - 8.1 g/dL   Albumin 3.9 3.5 - 5.0 g/dL   AST 20 15 - 41 U/L   ALT 21 17 - 63 U/L   Alkaline Phosphatase 55 38 - 126 U/L   Total Bilirubin 1.6 (H) 0.3 - 1.2 mg/dL   GFR calc non Af Amer >60 >60 mL/min   GFR calc Af Amer >60 >60 mL/min   Anion gap 10 5 - 15  CBC  Result Value Ref Range   WBC 6.3 4.0 - 10.5 K/uL   RBC 4.67 4.22 - 5.81 MIL/uL   Hemoglobin 14.8 13.0 - 17.0 g/dL   HCT 14.7 82.9 - 56.2 %   MCV 95.7 78.0 - 100.0 fL   MCH  31.7 26.0 - 34.0 pg   MCHC 33.1 30.0 - 36.0 g/dL   RDW 13.0 86.5 - 78.4 %   Platelets 233 150 - 400 K/uL  Urinalysis, Routine w reflex microscopic  Result Value Ref Range   Color, Urine YELLOW YELLOW   APPearance CLEAR CLEAR   Specific Gravity, Urine 1.020 1.005 - 1.030   pH 5.0 5.0 - 8.0   Glucose, UA NEGATIVE NEGATIVE mg/dL   Hgb urine dipstick NEGATIVE NEGATIVE   Bilirubin Urine NEGATIVE NEGATIVE   Ketones, ur NEGATIVE NEGATIVE mg/dL   Protein, ur NEGATIVE NEGATIVE mg/dL   Nitrite NEGATIVE NEGATIVE   Leukocytes, UA NEGATIVE NEGATIVE  POC occult blood, ED  Result Value Ref Range   Fecal Occult Bld NEGATIVE NEGATIVE   Ct Abdomen Pelvis W Contrast  Result Date: 04/25/2017 CLINICAL DATA:  Chronic sacrococcygeal pain, radiating to the level of the rectum. Constipation and bright red blood per rectum. Lower abdominal soreness. EXAM: CT ABDOMEN AND PELVIS WITH CONTRAST CT LUMBAR SPINE WITHOUT CONTRAST TECHNIQUE: Multidetector CT imaging of the abdomen and pelvis was performed using the standard protocol following bolus administration of intravenous contrast. Multidetector CT  imaging of the lumbar spine was performed without intravenous contrast administration. Multiplanar CT image reconstructions were also generated. CONTRAST:  ISOVUE-300 IOPAMIDOL (ISOVUE-300) INJECTION 61% COMPARISON:  None. FINDINGS: CT ABDOMEN AND PELVIS: Lower chest: The visualized lung bases are grossly clear. The visualized portions of the mediastinum are unremarkable. Hepatobiliary: Small nonspecific hypodensities within the liver measure up to 1.8 cm in size. The liver is otherwise unremarkable. The gallbladder is within normal limits. The common bile duct remains normal in caliber. Pancreas: The pancreas is within normal limits. Spleen: The spleen is unremarkable in appearance. Adrenals/Urinary Tract: The adrenal glands are unremarkable in appearance. The kidneys are within normal limits. There is no evidence of  hydronephrosis. No renal or ureteral stones are identified. No perinephric stranding is seen. Stomach/Bowel: The stomach is unremarkable in appearance. The small bowel is within normal limits. The appendix is not visualized; there is no evidence for appendicitis. Mild soft tissue inflammation is noted about an apparent diverticulum at the distal descending colon, which may reflect mild diverticulitis. Scattered diverticulosis is also noted along the proximal sigmoid colon. There is no evidence of perforation or abscess formation at this time. The rectum and anorectal canal are unremarkable in appearance. The surrounding soft tissues are unremarkable. Vascular/Lymphatic: Scattered calcification is seen along the abdominal aorta and its branches. The abdominal aorta is otherwise grossly unremarkable. The inferior vena cava is grossly unremarkable. No retroperitoneal lymphadenopathy is seen. No pelvic sidewall lymphadenopathy is identified. Reproductive: The bladder is mildly distended and grossly unremarkable. The prostate remains normal in size. Other: Diffuse soft tissue inflammation is noted at the proximal small bowel mesentery, nonspecific in appearance. Mildly prominent mesenteric nodes are also seen. There is mild chronic appearing soft tissue inflammation and skin thickening along the patient's pannus. Musculoskeletal: No acute osseous abnormalities are identified. The visualized musculature is unremarkable in appearance. CT LUMBAR SPINE: Segmentation: 5 lumbar type vertebrae. Alignment: Normal. Vertebrae: No acute fracture or focal pathologic process. There is suggestion of a vertebral body hemangioma at L1. Paraspinal and other soft tissues: The paraspinal musculature is unremarkable in appearance. Disc levels: Mild vacuum phenomenon is noted at the facets at L5-S1. Intervertebral disc spaces are preserved. IMPRESSION: 1. Suspect mild acute diverticulitis at the distal descending colon. Mild soft tissue  inflammation noted. No evidence of perforation or abscess formation at this time. 2. Rectum and anorectal canal are unremarkable in appearance. 3. Diffuse soft tissue inflammation along the proximal small bowel mesentery, nonspecific in appearance. Mildly prominent mesenteric nodes seen. 4. Scattered diverticulosis along the proximal sigmoid colon. 5. Chronic appearing soft tissue inflammation and skin thickening along the patient's pannus. 6. Small nonspecific hypodensities within the liver measure up to 1.8 cm in size. 7. No evidence of fracture or subluxation along the lumbar spine. Aortic Atherosclerosis (ICD10-I70.0). Electronically Signed   By: Roanna Raider M.D.   On: 04/25/2017 01:42   Ct L-spine No Charge  Result Date: 04/25/2017 CLINICAL DATA:  Chronic sacrococcygeal pain, radiating to the level of the rectum. Constipation and bright red blood per rectum. Lower abdominal soreness. EXAM: CT ABDOMEN AND PELVIS WITH CONTRAST CT LUMBAR SPINE WITHOUT CONTRAST TECHNIQUE: Multidetector CT imaging of the abdomen and pelvis was performed using the standard protocol following bolus administration of intravenous contrast. Multidetector CT imaging of the lumbar spine was performed without intravenous contrast administration. Multiplanar CT image reconstructions were also generated. CONTRAST:  ISOVUE-300 IOPAMIDOL (ISOVUE-300) INJECTION 61% COMPARISON:  None. FINDINGS: CT ABDOMEN AND PELVIS: Lower chest: The visualized lung  bases are grossly clear. The visualized portions of the mediastinum are unremarkable. Hepatobiliary: Small nonspecific hypodensities within the liver measure up to 1.8 cm in size. The liver is otherwise unremarkable. The gallbladder is within normal limits. The common bile duct remains normal in caliber. Pancreas: The pancreas is within normal limits. Spleen: The spleen is unremarkable in appearance. Adrenals/Urinary Tract: The adrenal glands are unremarkable in appearance. The kidneys are  within normal limits. There is no evidence of hydronephrosis. No renal or ureteral stones are identified. No perinephric stranding is seen. Stomach/Bowel: The stomach is unremarkable in appearance. The small bowel is within normal limits. The appendix is not visualized; there is no evidence for appendicitis. Mild soft tissue inflammation is noted about an apparent diverticulum at the distal descending colon, which may reflect mild diverticulitis. Scattered diverticulosis is also noted along the proximal sigmoid colon. There is no evidence of perforation or abscess formation at this time. The rectum and anorectal canal are unremarkable in appearance. The surrounding soft tissues are unremarkable. Vascular/Lymphatic: Scattered calcification is seen along the abdominal aorta and its branches. The abdominal aorta is otherwise grossly unremarkable. The inferior vena cava is grossly unremarkable. No retroperitoneal lymphadenopathy is seen. No pelvic sidewall lymphadenopathy is identified. Reproductive: The bladder is mildly distended and grossly unremarkable. The prostate remains normal in size. Other: Diffuse soft tissue inflammation is noted at the proximal small bowel mesentery, nonspecific in appearance. Mildly prominent mesenteric nodes are also seen. There is mild chronic appearing soft tissue inflammation and skin thickening along the patient's pannus. Musculoskeletal: No acute osseous abnormalities are identified. The visualized musculature is unremarkable in appearance. CT LUMBAR SPINE: Segmentation: 5 lumbar type vertebrae. Alignment: Normal. Vertebrae: No acute fracture or focal pathologic process. There is suggestion of a vertebral body hemangioma at L1. Paraspinal and other soft tissues: The paraspinal musculature is unremarkable in appearance. Disc levels: Mild vacuum phenomenon is noted at the facets at L5-S1. Intervertebral disc spaces are preserved. IMPRESSION: 1. Suspect mild acute diverticulitis at the  distal descending colon. Mild soft tissue inflammation noted. No evidence of perforation or abscess formation at this time. 2. Rectum and anorectal canal are unremarkable in appearance. 3. Diffuse soft tissue inflammation along the proximal small bowel mesentery, nonspecific in appearance. Mildly prominent mesenteric nodes seen. 4. Scattered diverticulosis along the proximal sigmoid colon. 5. Chronic appearing soft tissue inflammation and skin thickening along the patient's pannus. 6. Small nonspecific hypodensities within the liver measure up to 1.8 cm in size. 7. No evidence of fracture or subluxation along the lumbar spine. Aortic Atherosclerosis (ICD10-I70.0). Electronically Signed   By: Roanna RaiderJeffery  Chang M.D.   On: 04/25/2017 01:42      Antony MaduraHumes, Aleia Larocca, PA-C 04/25/17 0539    Antony MaduraHumes, Traycen Goyer, PA-C 04/25/17 0540    Ward, Layla MawKristen N, DO 04/25/17 [phone]

## 2017-04-25 NOTE — Telephone Encounter (Signed)
Pharmacy called related to Rx: Lactinex- permission to change to chewable tablet .Marland Kitchen.Marland Kitchen.EDCM clarified with EDP (Hedges) to change Rx to: chewable tablet.

## 2017-04-25 NOTE — ED Notes (Signed)
Patient transported to CT scan . 

## 2017-04-25 NOTE — Discharge Instructions (Signed)
Your imaging today was generally reassuring as was your blood work.  We advise continued follow-up with your gastroenterologist and primary care doctor.  Your CT did show findings of possible early diverticulitis.  For this, you have been placed on Augmentin.  Take as prescribed until finished.  A side effect of this antibiotic is diarrhea.  To prevent diarrhea you have been prescribed Lactinex.  You may use this or a different over-the-counter probiotic to prevent diarrhea.  Return to the emergency department as needed for new or concerning symptoms.

## 2017-05-08 ENCOUNTER — Other Ambulatory Visit (HOSPITAL_COMMUNITY): Payer: Self-pay | Admitting: General Surgery

## 2017-05-08 DIAGNOSIS — K5792 Diverticulitis of intestine, part unspecified, without perforation or abscess without bleeding: Secondary | ICD-10-CM

## 2017-05-12 ENCOUNTER — Ambulatory Visit (HOSPITAL_COMMUNITY): Admission: RE | Admit: 2017-05-12 | Payer: Commercial Managed Care - PPO | Source: Ambulatory Visit

## 2018-11-26 ENCOUNTER — Other Ambulatory Visit: Payer: Self-pay

## 2018-11-26 ENCOUNTER — Emergency Department (HOSPITAL_COMMUNITY)
Admission: EM | Admit: 2018-11-26 | Discharge: 2018-12-20 | Disposition: E | Payer: Self-pay | Attending: Emergency Medicine | Admitting: Emergency Medicine

## 2018-11-26 DIAGNOSIS — Z20828 Contact with and (suspected) exposure to other viral communicable diseases: Secondary | ICD-10-CM | POA: Insufficient documentation

## 2018-11-26 DIAGNOSIS — I10 Essential (primary) hypertension: Secondary | ICD-10-CM | POA: Insufficient documentation

## 2018-11-26 DIAGNOSIS — I469 Cardiac arrest, cause unspecified: Secondary | ICD-10-CM | POA: Insufficient documentation

## 2018-11-27 LAB — SARS CORONAVIRUS 2 (TAT 6-24 HRS): SARS Coronavirus 2: NEGATIVE

## 2018-11-28 ENCOUNTER — Telehealth (HOSPITAL_COMMUNITY): Payer: Self-pay

## 2018-12-20 NOTE — Progress Notes (Signed)
   2018-12-17 1900  Clinical Encounter Type  Visited With Health care provider;Family  Visit Type Initial;ED;Death;Psychological support  Referral From Nurse  Spiritual Encounters  Spiritual Needs Emotional;Grief support;Brochure  Stress Factors  Family Stress Factors Loss of control;Loss;Major life changes   Contacted by ED to meet w/ daughter of pt who was post-cpr.  She had just travelled bk from [facility] in response to texts from pt earlier today.  Daughter is only child, provided list of local funeral home and crematoriums.  Also provided her w/ pt placement card to contact them when she makes a decision.  Present when Dr. Billy Fischer informed about father's demise.  Chaplain found accompanied daughter to see her father in trauma bay.  Also located her boyfriend in ED parking lot to accompany him in to support her.  Offered info about talking to pt's 15 yo granddaughter about death of pt, declined by daughter.  [place], [phone]

## 2018-12-20 NOTE — ED Notes (Addendum)
Pt's daughter came to purple [place] to use restroom and this tech escorted her back to consultation room. Pt asked about making a phone call, pt's belongings and a list of cremation services. This tech instructed her on how to dial out on the phone. I also went and talked to [employer] and [employer] NT about pt's belongings. Camryn stated pt only came in with shorts on and no belongings were found. Pt's pockets checked and nothing was found. This tech went with chaplain to see family and informed daughter that there were no belongings to give her at this time. She expressed understanding

## 2018-12-20 NOTE — ED Triage Notes (Signed)
BIB ems CPR in progress. Witnessed arrest when transferring from couch to stretcher. Originally called out for SOB X2 weeks, developed CP and dizziness today. No reported medical hx. Dr Billy Fischer to EMS bay on EMS arrival. Capnography en route down to 8 from originally 21.

## 2018-12-20 NOTE — ED Provider Notes (Signed)
[facility] Provider Note   CSN: [id number] Arrival date & time: 12/11/2018  1707     History   Chief Complaint No chief complaint on file.   HPI Jolly Mangorthur Batts is a 58 y.o. male.      HPI   58 year old male with history of hypertension, obesity, vertigo, presents via EMS with cardiac arrest.  Patient had been reporting at least 2 weeks of shortness of breath, and reportedly had worsened today.  Per report the landlord is a prior nurse, checked his oxygen saturations prior to EMS arrival found him to be 55%.  When EMS arrived, patient was dyspneic, and they were attempting to move him when he became unresponsive.  He was found to be in PEA arrest.  They initiated CPR, with patient receiving epinephrine and CPR without change in rhythm.   After the initial 25 minutes of CPR, patient's end-tidal CO2 was in the 30s, however it continued to decrease during his 30-minute transport, and was 8 just prior to arrival.  At time of arrival to the emergency department, patient had received 45 minutes of CPR without return of spontaneous circulation, and with an end-tidal CO2 of 8.    Hypertension Obesity Vertigo  Home Medications    Prior to Admission medications   Not on File    Family History No family history on file.  Social History Social History   Tobacco Use  . Smoking status: Not on file  Substance Use Topics  . Alcohol use: Not on file  . Drug use: Not on file     Allergies   Patient has no allergy information on record.   Review of Systems Review of Systems  Unable to perform ROS: Patient unresponsive     Physical Exam Updated Vital Signs Ht 5\' 10"  (1.778 m)   Wt (!) 205 kg   BMI 64.85 kg/m   Physical Exam Vitals signs and nursing note reviewed.  Constitutional:      Appearance: He is obese.  HENT:     Head: Normocephalic and atraumatic.  Eyes:     Comments: Pupils 5mm, nonreactive   Cardiovascular:   Comments: pulseless Pulmonary:     Comments: King airway in place, no respiratory effort Skin:    General: Skin is dry.     Comments: Cool, cyanotic  Neurological:     Mental Status: He is unresponsive.      ED Treatments / Results  Labs (all labs ordered are listed, but only abnormal results are displayed) Labs Reviewed  SARS CORONAVIRUS 2 (TAT 6-24 HRS)    EKG None  Radiology No results found.  Procedures .Critical Care Performed by: Alvira MondaySchlossman, Charmel Pronovost, MD Authorized by: Alvira MondaySchlossman, Dheeraj Hail, MD   Critical care provider statement:    Critical care time (minutes):  30   Critical care was time spent personally by me on the following activities:  Discussions with consultants, obtaining history from patient or surrogate and review of old charts   (including critical care time)  Medications Ordered in ED Medications - No data to display   Initial Impression / Assessment and Plan / ED Course  I have reviewed the triage vital signs and the nursing notes.  Pertinent labs & imaging results that were available during my care of the patient were reviewed by me and considered in my medical decision making (see chart for details).       58 year old male with history of hypertension, obesity, vertigo, presents via EMS with cardiac  arrest.  EMS was initially called out for shortness of breath, but patient had loss of pulses shortly after their arrival.  They initiated CPR, however were unable to obtain return of spontaneous circulation after 45 minutes of CPR, with an end-tidal CO2 of 8 prior to arrival to the emergency department.  On arrival to the emergency department, reviewed these details with the team and EMS, and felt that continuing resuscitation was not in the patient's best interest.  He is noted to have pupils fixed and dilated.  Stopped efforts of CPR, and time of death was called at 5 PM.  His daughter came to the emergency department, and was notified with chaplain at  bedside.  Discussed with medical examiner, who agrees that this is not a medical examiner case.  Notified on-call provider for [facility], Dr. Derrek Monaco, for patient's PCP Dr. Reece Levy.  I also later had a discussion with his sister in [place] over the phone regarding his presentation leading up to his arrest and care provided.  Suspect likely respiratory arrest, consider with weeks of dyspnea pneumonia, COVID19, CHF, PE.  COVID-19 test is pending at this time.     Final Clinical Impressions(s) / ED Diagnoses   Final diagnoses:  Cardiac arrest [facility])    ED Discharge Orders    None       Gareth Morgan, MD 11/27/18 0111

## 2018-12-20 DEATH — deceased

## 2018-12-26 IMAGING — CR DG CHEST 2V
2 series · 2 of 2 positions shown · non-contrast
Comparison: None.

CLINICAL DATA: Shortness of breath tonight.

EXAM:
CHEST  2 VIEW

[chest pa]
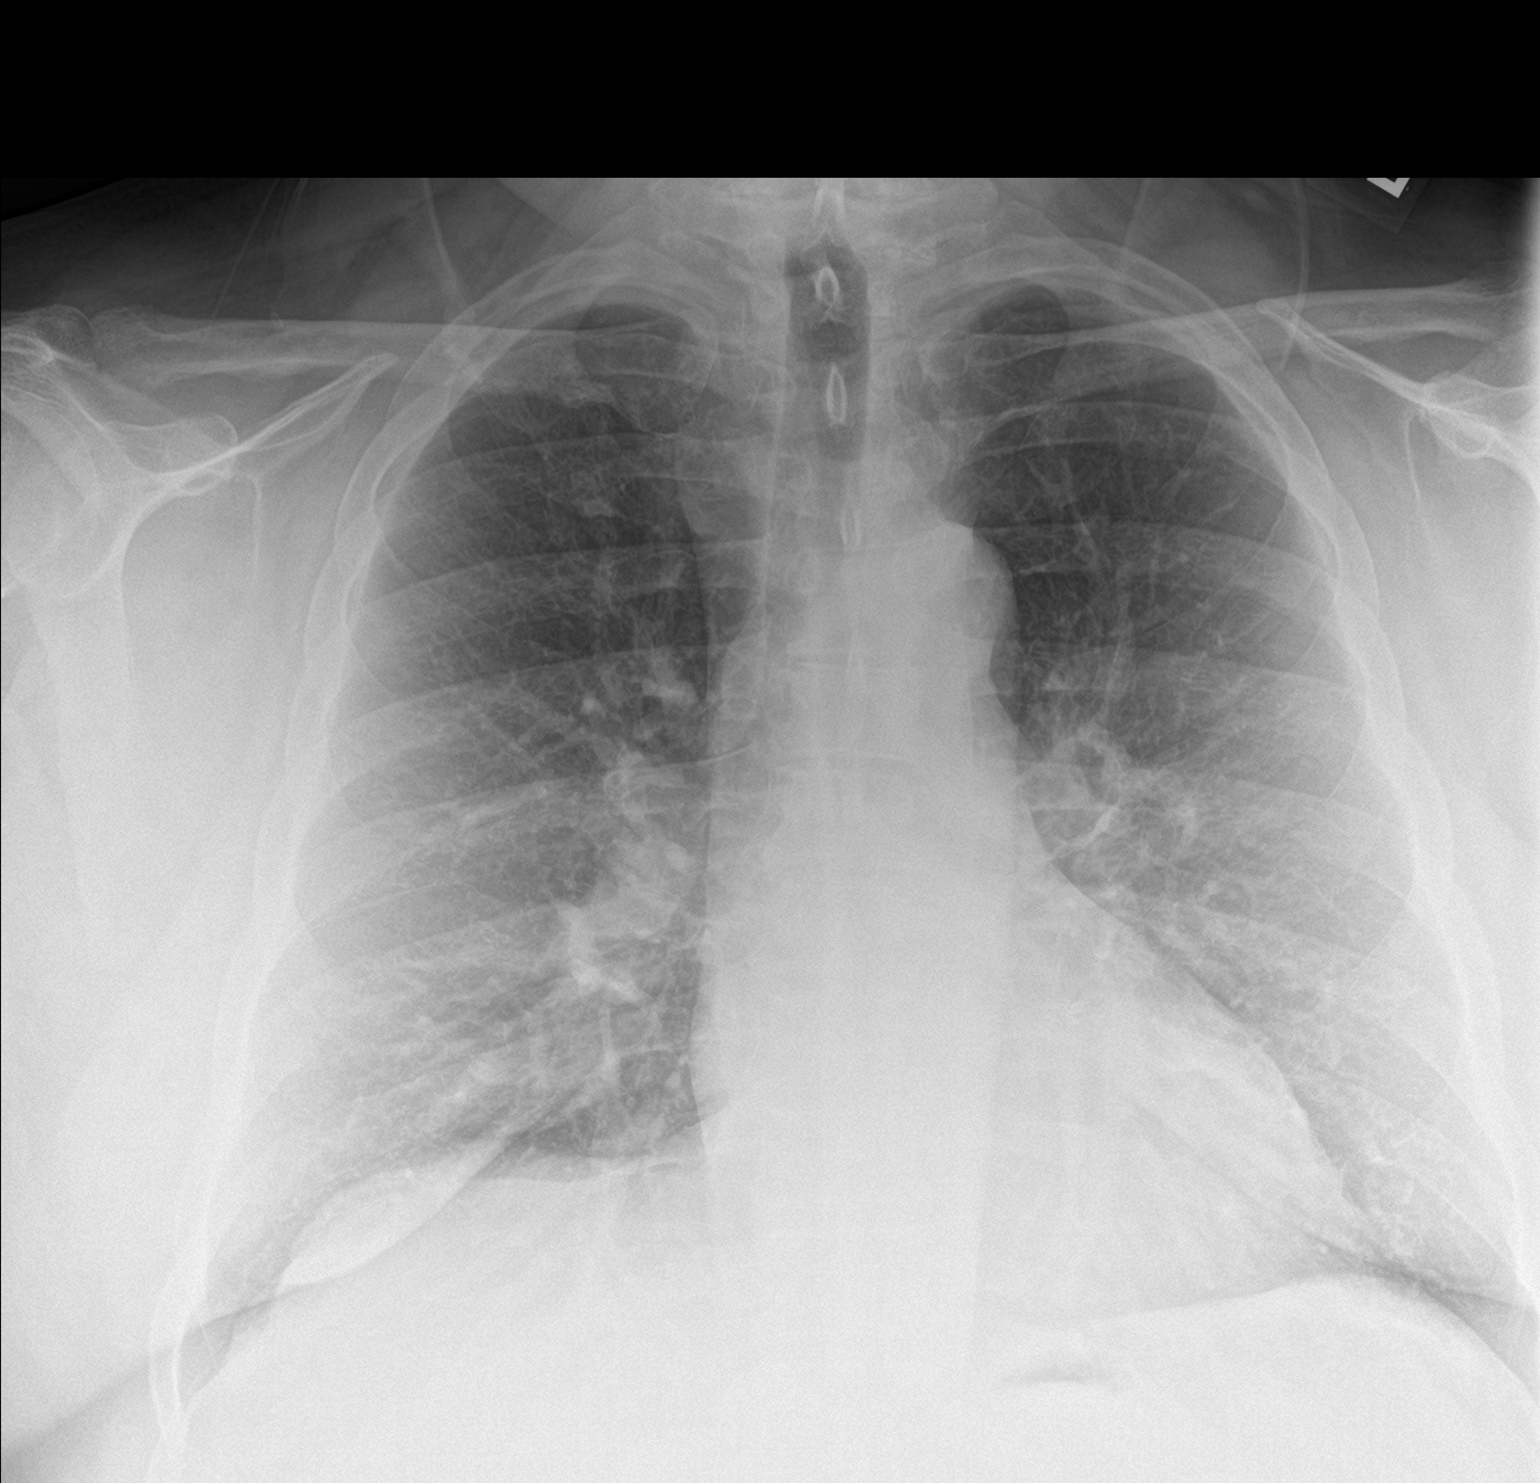

[chest lat]
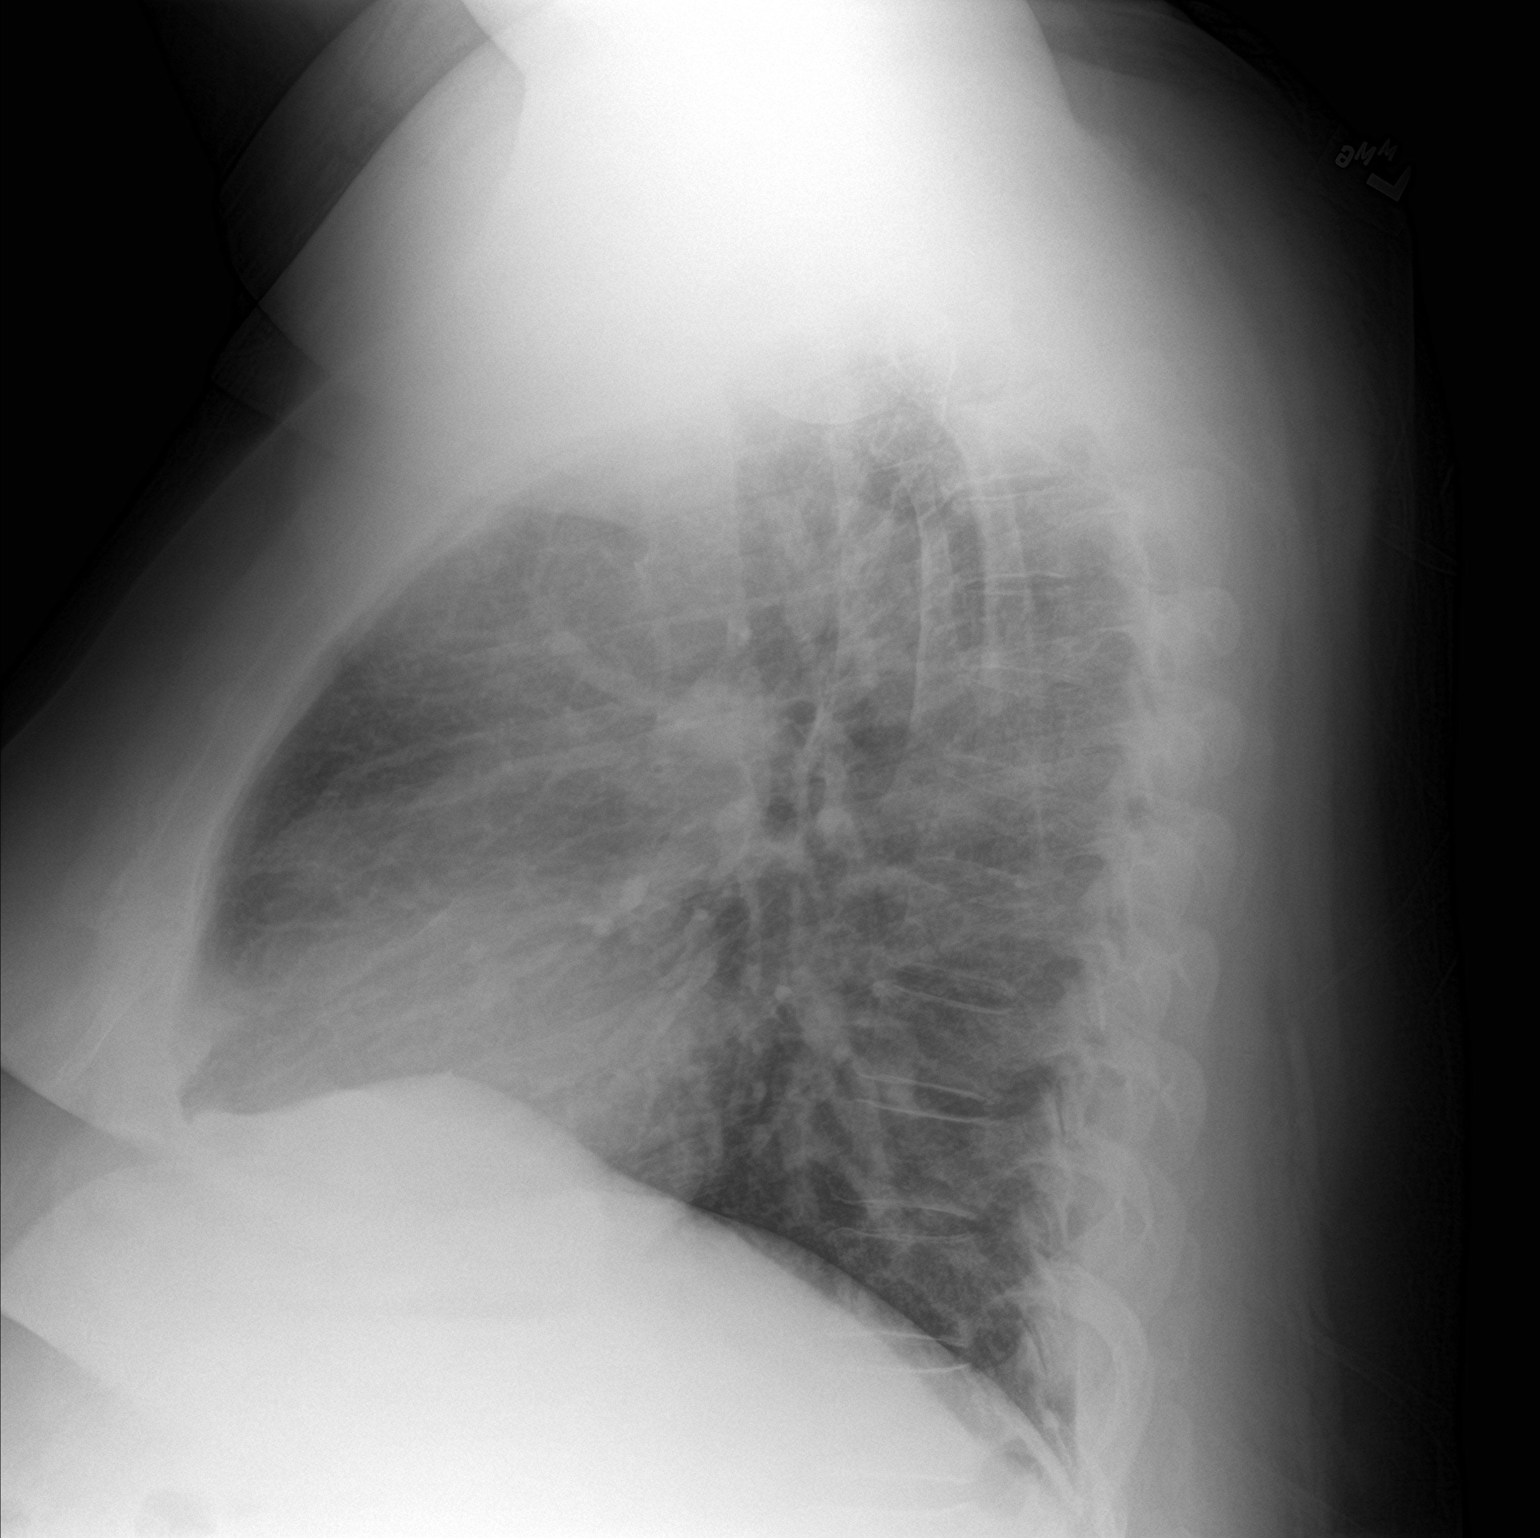

[2 of 2 positions shown; findings below may reference images not displayed]

FINDINGS: Mild hyperinflation. Central bronchitic changes. The
cardiomediastinal contours are normal. Mild vascular congestion. No
consolidation, pleural effusion, or pneumothorax. No acute osseous
abnormalities are seen.
IMPRESSION: Mild hyperinflation and central bronchitic change, can be seen with
bronchitis or asthma.

Mild vascular congestion.

## 2019-11-07 IMAGING — CT CT L SPINE W/O CM
5 of 11 series · 14 of 33 positions shown, 15 images · IV contrast (APPLIED)
Comparison: None.

CLINICAL DATA: Chronic sacrococcygeal pain, radiating to the level
of the rectum. Constipation and bright red blood per rectum. Lower
abdominal soreness.

EXAM:
CT ABDOMEN AND PELVIS WITH CONTRAST
CT LUMBAR SPINE WITHOUT CONTRAST
TECHNIQUE: Multidetector CT imaging of the abdomen and pelvis was performed
using the standard protocol following bolus administration of
intravenous contrast. Multidetector CT imaging of the lumbar spine
was performed without intravenous contrast administration.
Multiplanar CT image reconstructions were also generated.
CONTRAST:  100mL [id number] IOPAMIDOL ([id number]) INJECTION 61%

[Series 3: abdomen 5.0 · axial · 0.98mm/px · z∈[-496,+64]mm · 3 of 113 slices shown, 4 images]
[im 1/113  soft-tissue]
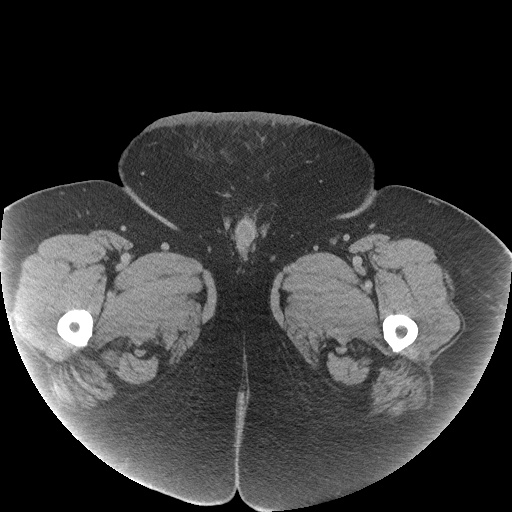
[im 1/113  bone]
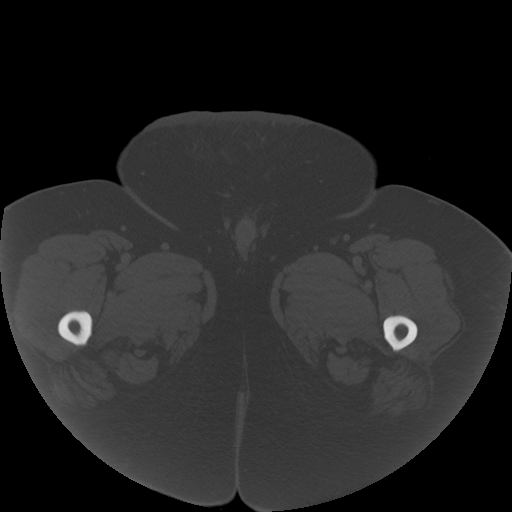
[im 57/113  bone]
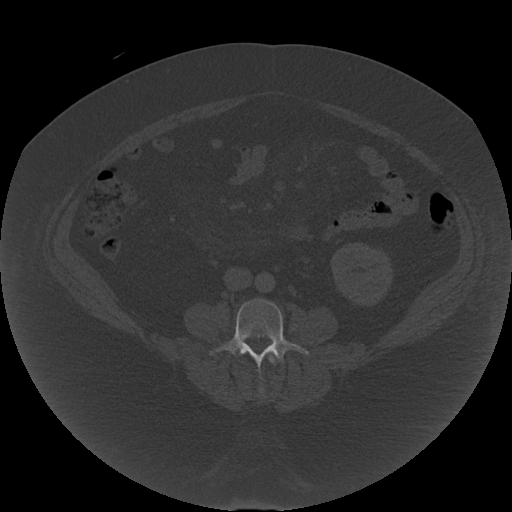
[im 113/113  bone]
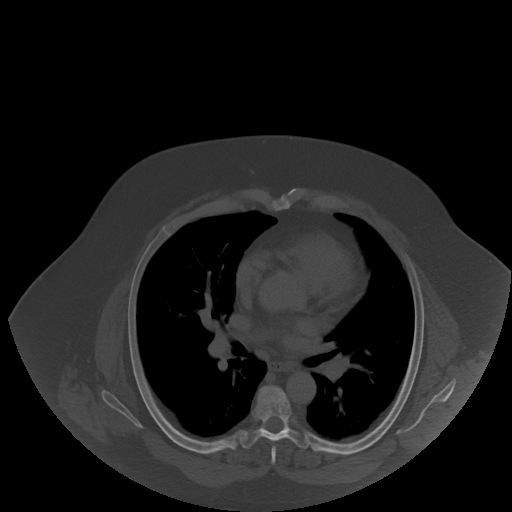

[Series 6: abdomen 3.0 mpr cor · coronal · 1.10mm/px · 2 of 150 slices shown]
[im 50/150  bone]
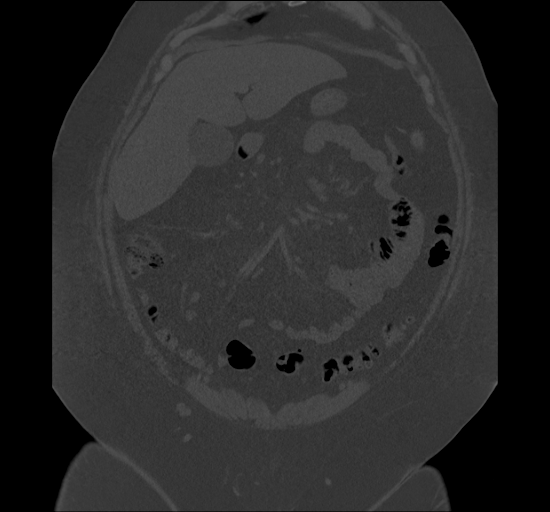
[im 100/150  bone]
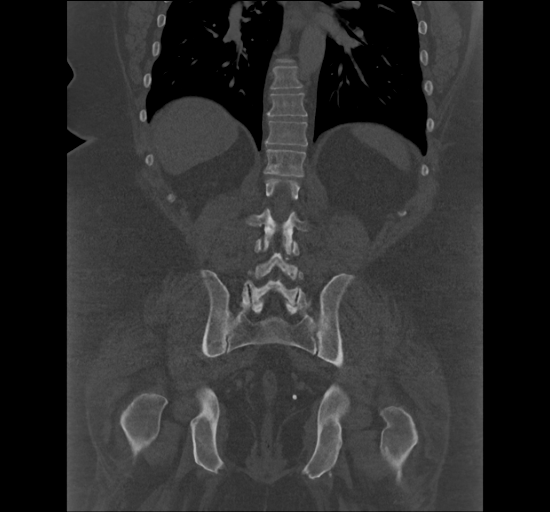

[Series 7: abdomen 3.0 mpr sag · sagittal · 0.99mm/px · 5 of 163 slices shown]
[im 28/163  bone]
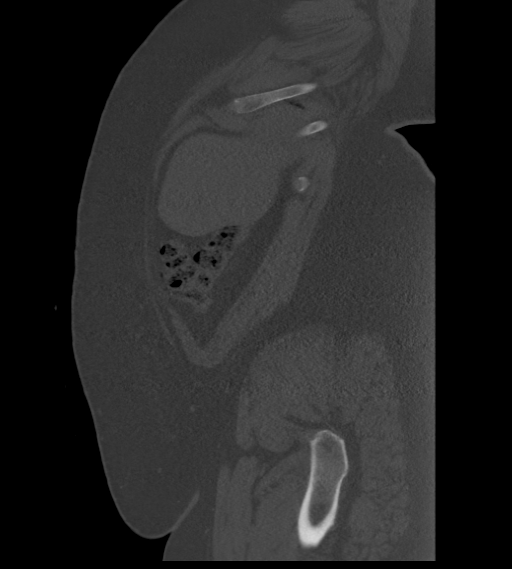
[im 55/163  bone]
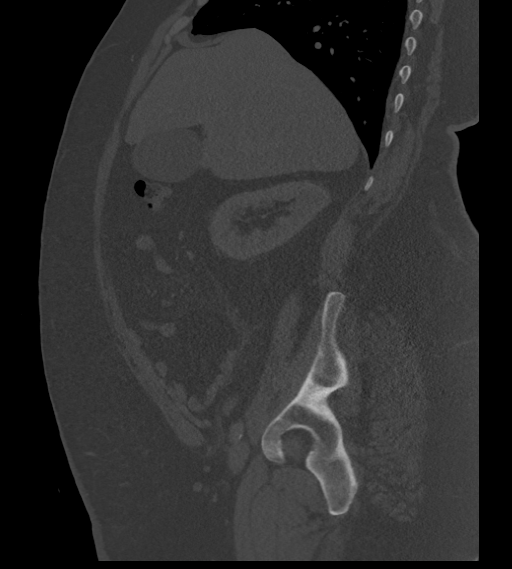
[im 82/163  bone]
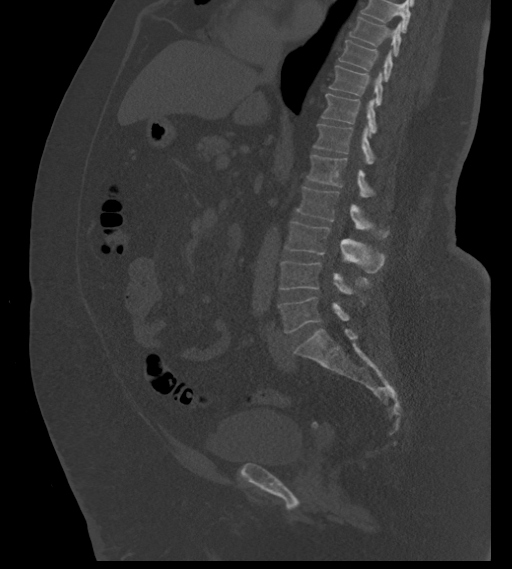
[im 109/163  bone]
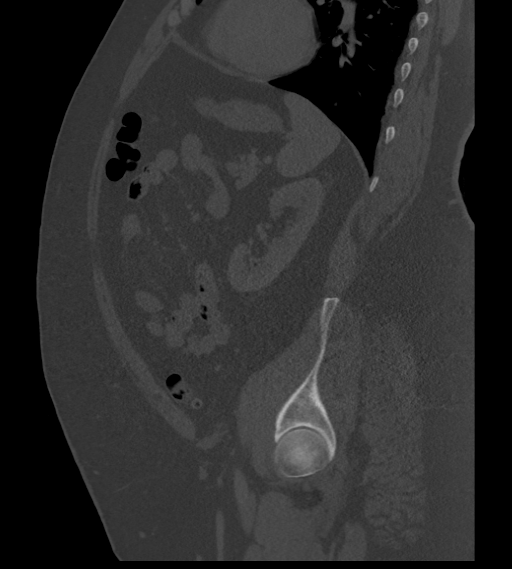
[im 136/163  bone]
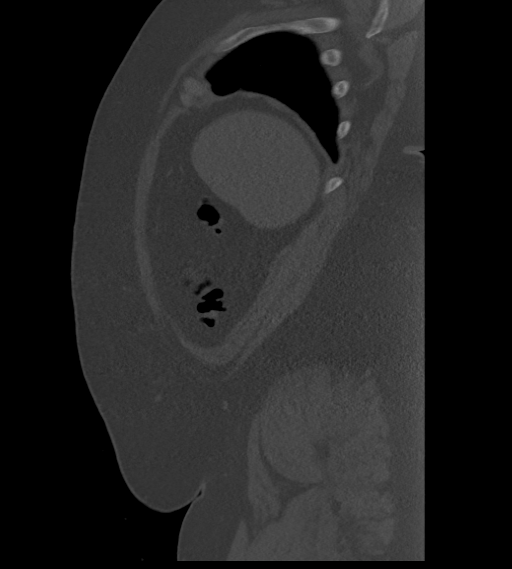

[Series 10: lumbar soft tissue axial · axial · 0.54mm/px · z∈[-267,-173]mm · 2 of 142 slices shown]
[im 48/142  soft-tissue]
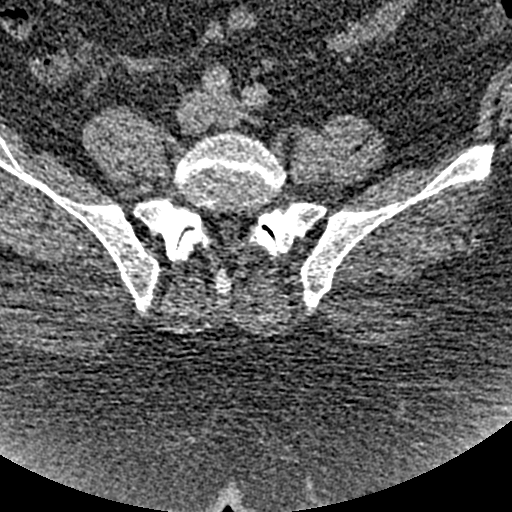
[im 95/142  soft-tissue]
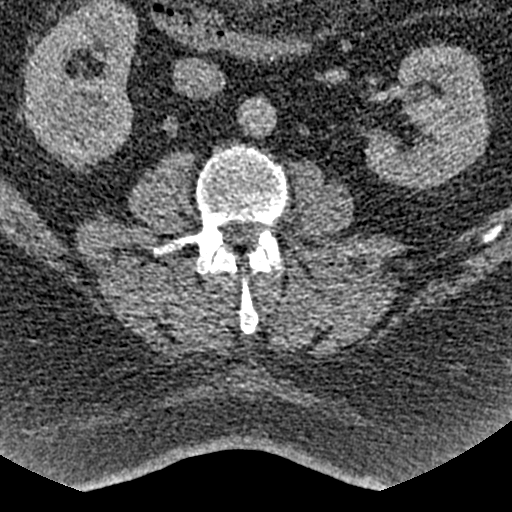

[Series 11: lumbarbone axial · axial · 0.54mm/px · z∈[-283,-181]mm · 2 of 154 slices shown]
[im 52/154  bone]
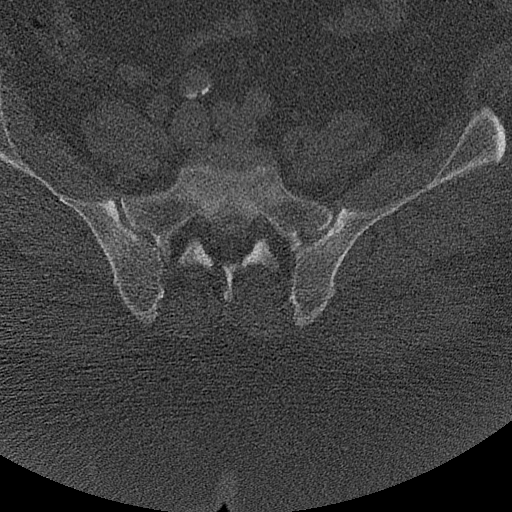
[im 103/154  bone]
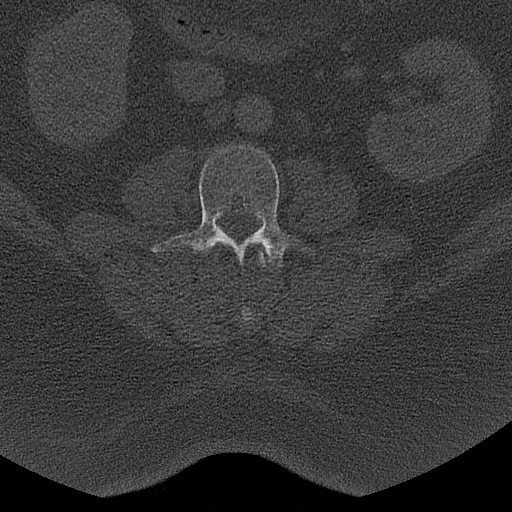

[14 of 33 positions shown; findings below may reference images not displayed]

FINDINGS: CT ABDOMEN AND PELVIS:

Lower chest: The visualized lung bases are grossly clear. The
visualized portions of the mediastinum are unremarkable.

Hepatobiliary: Small nonspecific hypodensities within the liver
measure up to 1.8 cm in size. The liver is otherwise unremarkable.
The gallbladder is within normal limits. The common bile duct
remains normal in caliber.

Pancreas: The pancreas is within normal limits.

Spleen: The spleen is unremarkable in appearance.

Adrenals/Urinary Tract: The adrenal glands are unremarkable in
appearance. The kidneys are within normal limits. There is no
evidence of hydronephrosis. No renal or ureteral stones are
identified. No perinephric stranding is seen.

Stomach/Bowel: The stomach is unremarkable in appearance. The small
bowel is within normal limits. The appendix is not visualized; there
is no evidence for appendicitis.

Mild soft tissue inflammation is noted about an apparent
diverticulum at the distal descending colon, which may reflect mild
diverticulitis. Scattered diverticulosis is also noted along the
proximal sigmoid colon. There is no evidence of perforation or
abscess formation at this time.

The rectum and anorectal canal are unremarkable in appearance. The
surrounding soft tissues are unremarkable.

Vascular/Lymphatic: Scattered calcification is seen along the
abdominal aorta and its branches. The abdominal aorta is otherwise
grossly unremarkable. The inferior vena cava is grossly
unremarkable. No retroperitoneal lymphadenopathy is seen. No pelvic
sidewall lymphadenopathy is identified.

Reproductive: The bladder is mildly distended and grossly
unremarkable. The prostate remains normal in size.

Other: Diffuse soft tissue inflammation is noted at the proximal
small bowel mesentery, nonspecific in appearance. Mildly prominent
mesenteric nodes are also seen.

There is mild chronic appearing soft tissue inflammation and skin
thickening along the patient's pannus.

Musculoskeletal: No acute osseous abnormalities are identified. The
visualized musculature is unremarkable in appearance.

CT LUMBAR SPINE:

Segmentation: 5 lumbar type vertebrae.

Alignment: Normal.

Vertebrae: No acute fracture or focal pathologic process. There is
suggestion of a vertebral body hemangioma at L1.

Paraspinal and other soft tissues: The paraspinal musculature is
unremarkable in appearance.

Disc levels: Mild vacuum phenomenon is noted at the facets at L5-S1.
Intervertebral disc spaces are preserved.
IMPRESSION: 1. Suspect mild acute diverticulitis at the distal descending colon.
Mild soft tissue inflammation noted. No evidence of perforation or
abscess formation at this time.
2. Rectum and anorectal canal are unremarkable in appearance.
3. Diffuse soft tissue inflammation along the proximal small bowel
mesentery, nonspecific in appearance. Mildly prominent mesenteric
nodes seen.
4. Scattered diverticulosis along the proximal sigmoid colon.
5. Chronic appearing soft tissue inflammation and skin thickening
along the patient's pannus.
6. Small nonspecific hypodensities within the liver measure up to
1.8 cm in size.
7. No evidence of fracture or subluxation along the lumbar spine.

Aortic Atherosclerosis ([id number]).
